# Patient Record
Sex: Male | Born: 1935 | Race: White | Hispanic: No | Marital: Married | Smoking: Former smoker
Health system: Southern US, Community
[De-identification: ages and names within clinical notes are randomized; demographics above are authoritative.]

## PROBLEM LIST (undated history)

## (undated) DIAGNOSIS — K219 Gastro-esophageal reflux disease without esophagitis: Secondary | ICD-10-CM

## (undated) DIAGNOSIS — E785 Hyperlipidemia, unspecified: Secondary | ICD-10-CM

## (undated) DIAGNOSIS — R609 Edema, unspecified: Secondary | ICD-10-CM

## (undated) DIAGNOSIS — N281 Cyst of kidney, acquired: Secondary | ICD-10-CM

## (undated) DIAGNOSIS — R001 Bradycardia, unspecified: Secondary | ICD-10-CM

## (undated) DIAGNOSIS — R7303 Prediabetes: Secondary | ICD-10-CM

## (undated) DIAGNOSIS — C189 Malignant neoplasm of colon, unspecified: Secondary | ICD-10-CM

## (undated) DIAGNOSIS — Z7901 Long term (current) use of anticoagulants: Secondary | ICD-10-CM

## (undated) DIAGNOSIS — I499 Cardiac arrhythmia, unspecified: Secondary | ICD-10-CM

## (undated) DIAGNOSIS — I82409 Acute embolism and thrombosis of unspecified deep veins of unspecified lower extremity: Secondary | ICD-10-CM

## (undated) DIAGNOSIS — K589 Irritable bowel syndrome without diarrhea: Secondary | ICD-10-CM

## (undated) DIAGNOSIS — I700 Atherosclerosis of aorta: Secondary | ICD-10-CM

## (undated) DIAGNOSIS — R002 Palpitations: Secondary | ICD-10-CM

## (undated) DIAGNOSIS — R011 Cardiac murmur, unspecified: Secondary | ICD-10-CM

## (undated) DIAGNOSIS — N183 Chronic kidney disease, stage 3 unspecified: Secondary | ICD-10-CM

## (undated) DIAGNOSIS — M4850XA Collapsed vertebra, not elsewhere classified, site unspecified, initial encounter for fracture: Secondary | ICD-10-CM

## (undated) DIAGNOSIS — H9190 Unspecified hearing loss, unspecified ear: Secondary | ICD-10-CM

## (undated) DIAGNOSIS — M4856XA Collapsed vertebra, not elsewhere classified, lumbar region, initial encounter for fracture: Secondary | ICD-10-CM

## (undated) DIAGNOSIS — C801 Malignant (primary) neoplasm, unspecified: Secondary | ICD-10-CM

## (undated) DIAGNOSIS — N2581 Secondary hyperparathyroidism of renal origin: Secondary | ICD-10-CM

## (undated) DIAGNOSIS — I4510 Unspecified right bundle-branch block: Secondary | ICD-10-CM

## (undated) DIAGNOSIS — Z87898 Personal history of other specified conditions: Secondary | ICD-10-CM

## (undated) DIAGNOSIS — R0600 Dyspnea, unspecified: Secondary | ICD-10-CM

## (undated) DIAGNOSIS — K279 Peptic ulcer, site unspecified, unspecified as acute or chronic, without hemorrhage or perforation: Secondary | ICD-10-CM

## (undated) DIAGNOSIS — I10 Essential (primary) hypertension: Secondary | ICD-10-CM

## (undated) DIAGNOSIS — K4020 Bilateral inguinal hernia, without obstruction or gangrene, not specified as recurrent: Secondary | ICD-10-CM

## (undated) DIAGNOSIS — I495 Sick sinus syndrome: Secondary | ICD-10-CM

## (undated) HISTORY — PX: PILONIDAL CYST EXCISION: SHX744

## (undated) HISTORY — PX: COLECTOMY: SHX59

---
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
## 2004-04-06 ENCOUNTER — Ambulatory Visit: Payer: Self-pay | Admitting: Oncology

## 2004-05-07 ENCOUNTER — Ambulatory Visit: Payer: Self-pay | Admitting: Oncology

## 2004-05-17 ENCOUNTER — Ambulatory Visit: Payer: Self-pay | Admitting: Gastroenterology

## 2004-06-06 ENCOUNTER — Ambulatory Visit: Payer: Self-pay | Admitting: Oncology

## 2004-07-07 HISTORY — PX: PARTIAL COLECTOMY: SHX5273

## 2004-07-07 HISTORY — PX: COLON SURGERY: SHX602

## 2004-07-26 ENCOUNTER — Ambulatory Visit: Payer: Self-pay | Admitting: Oncology

## 2004-08-07 ENCOUNTER — Ambulatory Visit: Payer: Self-pay | Admitting: Oncology

## 2004-10-25 ENCOUNTER — Ambulatory Visit: Payer: Self-pay | Admitting: Oncology

## 2004-11-04 ENCOUNTER — Ambulatory Visit: Payer: Self-pay | Admitting: Oncology

## 2005-02-28 ENCOUNTER — Ambulatory Visit: Payer: Self-pay | Admitting: Oncology

## 2005-03-07 ENCOUNTER — Ambulatory Visit: Payer: Self-pay | Admitting: Oncology

## 2005-05-15 ENCOUNTER — Ambulatory Visit: Payer: Self-pay | Admitting: Oncology

## 2005-06-06 ENCOUNTER — Ambulatory Visit: Payer: Self-pay | Admitting: Oncology

## 2005-07-18 ENCOUNTER — Ambulatory Visit: Payer: Self-pay | Admitting: Oncology

## 2005-08-07 ENCOUNTER — Ambulatory Visit: Payer: Self-pay | Admitting: Oncology

## 2006-01-14 ENCOUNTER — Ambulatory Visit: Payer: Self-pay | Admitting: Oncology

## 2006-02-04 ENCOUNTER — Ambulatory Visit: Payer: Self-pay | Admitting: Oncology

## 2006-03-03 ENCOUNTER — Ambulatory Visit: Payer: Self-pay | Admitting: Gastroenterology

## 2006-04-15 ENCOUNTER — Ambulatory Visit: Payer: Self-pay | Admitting: Oncology

## 2006-04-16 ENCOUNTER — Other Ambulatory Visit: Payer: Self-pay

## 2006-05-07 ENCOUNTER — Ambulatory Visit: Payer: Self-pay | Admitting: Oncology

## 2006-05-14 ENCOUNTER — Ambulatory Visit: Payer: Self-pay | Admitting: Cardiology

## 2006-07-14 ENCOUNTER — Ambulatory Visit: Payer: Self-pay | Admitting: Oncology

## 2006-08-07 ENCOUNTER — Ambulatory Visit: Payer: Self-pay | Admitting: Oncology

## 2006-09-05 ENCOUNTER — Ambulatory Visit: Payer: Self-pay | Admitting: Oncology

## 2006-09-30 ENCOUNTER — Ambulatory Visit: Payer: Self-pay | Admitting: Oncology

## 2006-10-06 ENCOUNTER — Ambulatory Visit: Payer: Self-pay | Admitting: Oncology

## 2007-01-05 ENCOUNTER — Ambulatory Visit: Payer: Self-pay | Admitting: Oncology

## 2007-01-13 ENCOUNTER — Ambulatory Visit: Payer: Self-pay | Admitting: Oncology

## 2007-02-05 ENCOUNTER — Ambulatory Visit: Payer: Self-pay | Admitting: Oncology

## 2007-05-08 ENCOUNTER — Ambulatory Visit: Payer: Self-pay | Admitting: Oncology

## 2007-05-13 ENCOUNTER — Ambulatory Visit: Payer: Self-pay | Admitting: Oncology

## 2007-06-07 ENCOUNTER — Ambulatory Visit: Payer: Self-pay | Admitting: Oncology

## 2007-10-06 ENCOUNTER — Ambulatory Visit: Payer: Self-pay | Admitting: Oncology

## 2008-04-03 ENCOUNTER — Ambulatory Visit: Payer: Self-pay | Admitting: Oncology

## 2008-04-06 ENCOUNTER — Ambulatory Visit: Payer: Self-pay | Admitting: Oncology

## 2009-04-20 ENCOUNTER — Ambulatory Visit: Payer: Self-pay | Admitting: Oncology

## 2009-05-07 ENCOUNTER — Ambulatory Visit: Payer: Self-pay | Admitting: Oncology

## 2009-07-12 ENCOUNTER — Ambulatory Visit: Payer: Self-pay | Admitting: Gastroenterology

## 2012-12-29 ENCOUNTER — Ambulatory Visit: Payer: Self-pay | Admitting: Gastroenterology

## 2012-12-31 LAB — PATHOLOGY REPORT

## 2016-07-07 DIAGNOSIS — Z9841 Cataract extraction status, right eye: Secondary | ICD-10-CM

## 2016-07-07 HISTORY — DX: Cataract extraction status, right eye: Z98.41

## 2016-10-23 ENCOUNTER — Encounter: Payer: Self-pay | Admitting: *Deleted

## 2016-10-28 MED ORDER — MOXIFLOXACIN HCL 0.5 % OP SOLN
1.0000 [drp] | OPHTHALMIC | Status: AC
  Administered 2016-10-29: 1 [drp] via OPHTHALMIC

## 2016-10-28 MED ORDER — PHENYLEPHRINE HCL 10 % OP SOLN
1.0000 [drp] | OPHTHALMIC | Status: AC
  Administered 2016-10-29: 1 [drp] via OPHTHALMIC

## 2016-10-28 MED ORDER — CYCLOPENTOLATE HCL 2 % OP SOLN
1.0000 [drp] | OPHTHALMIC | Status: AC
  Administered 2016-10-29: 1 [drp] via OPHTHALMIC

## 2016-10-28 NOTE — H&P (Signed)
See scanned note.

## 2016-10-29 ENCOUNTER — Ambulatory Visit
Admission: RE | Admit: 2016-10-29 | Discharge: 2016-10-29 | Disposition: A | Discharge status: Home / Self Care | Payer: Medicare Other | Source: Ambulatory Visit | Attending: Ophthalmology | Admitting: Ophthalmology

## 2016-10-29 ENCOUNTER — Ambulatory Visit: Payer: Medicare Other | Admitting: Anesthesiology

## 2016-10-29 ENCOUNTER — Encounter
Admission: RE | Disposition: A | Discharge status: Home / Self Care | Payer: Self-pay | Source: Ambulatory Visit | Attending: Ophthalmology

## 2016-10-29 ENCOUNTER — Encounter: Payer: Self-pay | Admitting: *Deleted

## 2016-10-29 DIAGNOSIS — Z87891 Personal history of nicotine dependence: Secondary | ICD-10-CM | POA: Insufficient documentation

## 2016-10-29 DIAGNOSIS — Z79899 Other long term (current) drug therapy: Secondary | ICD-10-CM | POA: Diagnosis not present

## 2016-10-29 DIAGNOSIS — H2512 Age-related nuclear cataract, left eye: Secondary | ICD-10-CM | POA: Insufficient documentation

## 2016-10-29 DIAGNOSIS — K219 Gastro-esophageal reflux disease without esophagitis: Secondary | ICD-10-CM | POA: Diagnosis not present

## 2016-10-29 HISTORY — DX: Malignant (primary) neoplasm, unspecified: C80.1

## 2016-10-29 HISTORY — DX: Gastro-esophageal reflux disease without esophagitis: K21.9

## 2016-10-29 HISTORY — DX: Unspecified hearing loss, unspecified ear: H91.90

## 2016-10-29 HISTORY — PX: CATARACT EXTRACTION W/PHACO: SHX586

## 2016-10-29 HISTORY — DX: Edema, unspecified: R60.9

## 2016-10-29 HISTORY — DX: Collapsed vertebra, not elsewhere classified, site unspecified, initial encounter for fracture: M48.50XA

## 2016-10-29 HISTORY — DX: Irritable bowel syndrome, unspecified: K58.9

## 2016-10-29 HISTORY — DX: Personal history of other specified conditions: Z87.898

## 2016-10-29 HISTORY — DX: Cardiac arrhythmia, unspecified: I49.9

## 2016-10-29 SURGERY — PHACOEMULSIFICATION, CATARACT, WITH IOL INSERTION
Anesthesia: Monitor Anesthesia Care | Site: Eye | Laterality: Left | Wound class: Clean

## 2016-10-29 MED ORDER — BUPIVACAINE HCL (PF) 0.75 % IJ SOLN
INTRAMUSCULAR | Status: AC
  Filled 2016-10-29: qty 10

## 2016-10-29 MED ORDER — NA CHONDROIT SULF-NA HYALURON 40-17 MG/ML IO SOLN
INTRAOCULAR | Status: AC
  Filled 2016-10-29: qty 1

## 2016-10-29 MED ORDER — MIDAZOLAM HCL 2 MG/2ML IJ SOLN
INTRAMUSCULAR | Status: DC | PRN
  Administered 2016-10-29 (×2): 0.5 mg via INTRAVENOUS
  Administered 2016-10-29: 1 mg via INTRAVENOUS

## 2016-10-29 MED ORDER — NA CHONDROIT SULF-NA HYALURON 40-17 MG/ML IO SOLN
INTRAOCULAR | Status: DC | PRN
  Administered 2016-10-29 (×2): 1 mL via INTRAOCULAR

## 2016-10-29 MED ORDER — GLYCOPYRROLATE 0.2 MG/ML IJ SOLN
INTRAMUSCULAR | Status: DC | PRN
  Administered 2016-10-29: 0.2 mg via INTRAVENOUS

## 2016-10-29 MED ORDER — MOXIFLOXACIN HCL 0.5 % OP SOLN
OPHTHALMIC | Status: DC | PRN
  Administered 2016-10-29: 0.2 mL via OPHTHALMIC

## 2016-10-29 MED ORDER — ALFENTANIL 500 MCG/ML IJ INJ
INJECTION | INTRAVENOUS | Status: DC | PRN
  Administered 2016-10-29: 250 ug via INTRAVENOUS
  Administered 2016-10-29: 500 ug via INTRAVENOUS

## 2016-10-29 MED ORDER — CEFUROXIME OPHTHALMIC INJECTION 1 MG/0.1 ML
INJECTION | OPHTHALMIC | Status: DC | PRN
  Administered 2016-10-29: 1 mg via INTRACAMERAL

## 2016-10-29 MED ORDER — MOXIFLOXACIN HCL 0.5 % OP SOLN
1.0000 [drp] | OPHTHALMIC | Status: AC
  Administered 2016-10-29 (×2): 1 [drp] via OPHTHALMIC

## 2016-10-29 MED ORDER — CEFUROXIME OPHTHALMIC INJECTION 1 MG/0.1 ML
INJECTION | OPHTHALMIC | Status: AC
  Filled 2016-10-29: qty 0.1

## 2016-10-29 MED ORDER — PHENYLEPHRINE HCL 10 % OP SOLN
OPHTHALMIC | Status: AC
  Administered 2016-10-29: 1 [drp] via OPHTHALMIC
  Filled 2016-10-29: qty 5

## 2016-10-29 MED ORDER — EPINEPHRINE PF 1 MG/ML IJ SOLN
INTRAMUSCULAR | Status: AC
  Filled 2016-10-29: qty 2

## 2016-10-29 MED ORDER — POVIDONE-IODINE 5 % OP SOLN
OPHTHALMIC | Status: DC | PRN
  Administered 2016-10-29: 1 via OPHTHALMIC

## 2016-10-29 MED ORDER — CYCLOPENTOLATE HCL 2 % OP SOLN
1.0000 [drp] | OPHTHALMIC | Status: AC
  Administered 2016-10-29 (×3): 1 [drp] via OPHTHALMIC

## 2016-10-29 MED ORDER — CYCLOPENTOLATE HCL 2 % OP SOLN
OPHTHALMIC | Status: AC
  Administered 2016-10-29: 1 [drp] via OPHTHALMIC
  Filled 2016-10-29: qty 2

## 2016-10-29 MED ORDER — PROPARACAINE HCL 0.5 % OP SOLN
OPHTHALMIC | Status: DC | PRN
  Administered 2016-10-29: 1 [drp] via OPHTHALMIC

## 2016-10-29 MED ORDER — CARBACHOL 0.01 % IO SOLN
INTRAOCULAR | Status: DC | PRN
  Administered 2016-10-29: 0.5 mL via INTRAOCULAR

## 2016-10-29 MED ORDER — TETRACAINE HCL 0.5 % OP SOLN
OPHTHALMIC | Status: DC | PRN
  Administered 2016-10-29: 1 [drp] via OPHTHALMIC

## 2016-10-29 MED ORDER — POVIDONE-IODINE 5 % OP SOLN
OPHTHALMIC | Status: AC
  Filled 2016-10-29: qty 30

## 2016-10-29 MED ORDER — SODIUM CHLORIDE 0.9 % IV SOLN
INTRAVENOUS | Status: DC
  Administered 2016-10-29: 50 mL/h via INTRAVENOUS

## 2016-10-29 MED ORDER — PHENYLEPHRINE HCL 10 % OP SOLN
1.0000 [drp] | OPHTHALMIC | Status: AC
  Administered 2016-10-29 (×3): 1 [drp] via OPHTHALMIC

## 2016-10-29 MED ORDER — LIDOCAINE HCL (PF) 4 % IJ SOLN
INTRAMUSCULAR | Status: DC | PRN
  Administered 2016-10-29: 4 mL via OPHTHALMIC

## 2016-10-29 MED ORDER — MOXIFLOXACIN HCL 0.5 % OP SOLN
OPHTHALMIC | Status: AC
  Administered 2016-10-29: 1 [drp] via OPHTHALMIC
  Filled 2016-10-29: qty 3

## 2016-10-29 MED ORDER — LIDOCAINE HCL (PF) 2 % IJ SOLN
INTRAMUSCULAR | Status: AC
  Filled 2016-10-29: qty 2

## 2016-10-29 MED ORDER — TETRACAINE HCL 0.5 % OP SOLN
OPHTHALMIC | Status: AC
  Filled 2016-10-29: qty 2

## 2016-10-29 MED ORDER — HYALURONIDASE HUMAN 150 UNIT/ML IJ SOLN
INTRAMUSCULAR | Status: AC
  Filled 2016-10-29: qty 1

## 2016-10-29 MED ORDER — LIDOCAINE HCL (PF) 4 % IJ SOLN
INTRAMUSCULAR | Status: DC | PRN
  Administered 2016-10-29: 9 mL via OPHTHALMIC

## 2016-10-29 MED ORDER — EPINEPHRINE PF 1 MG/ML IJ SOLN
INTRAMUSCULAR | Status: DC | PRN
  Administered 2016-10-29: 10:00:00 via OPHTHALMIC

## 2016-10-29 SURGICAL SUPPLY — 34 items
CANNULA ANT/CHMB 27G (MISCELLANEOUS) ×1 IMPLANT
CANNULA ANT/CHMB 27GA (MISCELLANEOUS) ×3 IMPLANT
CORD BIP STRL DISP 12FT (MISCELLANEOUS) ×3 IMPLANT
CUP MEDICINE 2OZ PLAST GRAD ST (MISCELLANEOUS) ×3 IMPLANT
DRAPE XRAY CASSETTE 23X24 (DRAPES) ×3 IMPLANT
ERASER HMR WETFIELD 18G (MISCELLANEOUS) ×3 IMPLANT
GLOVE BIO SURGEON STRL SZ8 (GLOVE) ×3 IMPLANT
GLOVE SURG LX 6.5 MICRO (GLOVE) ×2
GLOVE SURG LX 8.0 MICRO (GLOVE) ×2
GLOVE SURG LX STRL 6.5 MICRO (GLOVE) ×1 IMPLANT
GLOVE SURG LX STRL 8.0 MICRO (GLOVE) ×1 IMPLANT
GOWN STRL REUS W/ TWL LRG LVL3 (GOWN DISPOSABLE) ×1 IMPLANT
GOWN STRL REUS W/ TWL XL LVL3 (GOWN DISPOSABLE) ×1 IMPLANT
GOWN STRL REUS W/TWL LRG LVL3 (GOWN DISPOSABLE) ×3
GOWN STRL REUS W/TWL XL LVL3 (GOWN DISPOSABLE) ×3
KIT IRRIGAT 0.9 MICROSMOOTH (MISCELLANEOUS) ×2 IMPLANT
LENS IOL ACRSF IQ TRC 8 18.5 IMPLANT
LENS IOL ACRYSOF IQ TORIC 18.5 ×3 IMPLANT
LENS IOL IQ TORIC 8 18.5 ×1 IMPLANT
PACK CATARACT (MISCELLANEOUS) ×3 IMPLANT
PACK CATARACT DINGLEDEIN LX (MISCELLANEOUS) ×3 IMPLANT
PACK EYE AFTER SURG (MISCELLANEOUS) ×3 IMPLANT
SHLD EYE VISITEC  UNIV (MISCELLANEOUS) ×3 IMPLANT
SOL BSS BAG (MISCELLANEOUS) ×3
SOL PREP PVP 2OZ (MISCELLANEOUS) ×3
SOLUTION BSS BAG (MISCELLANEOUS) ×1 IMPLANT
SOLUTION PREP PVP 2OZ (MISCELLANEOUS) ×1 IMPLANT
SUT ETHILON 10 0 CS140 6 (SUTURE) ×2 IMPLANT
SUT SILK 5-0 (SUTURE) ×3 IMPLANT
SYR 3ML LL SCALE MARK (SYRINGE) ×3 IMPLANT
SYR 5ML LL (SYRINGE) ×3 IMPLANT
SYR TB 1ML 27GX1/2 LL (SYRINGE) ×3 IMPLANT
WATER STERILE IRR 250ML POUR (IV SOLUTION) ×3 IMPLANT
WIPE NON LINTING 3.25X3.25 (MISCELLANEOUS) ×3 IMPLANT

## 2016-10-29 NOTE — Interval H&P Note (Signed)
History and Physical Interval Note:  10/29/2016 9:56 AM  Juan Arnold  has presented today for surgery, with the diagnosis of NUCLEAR SCLEROTIC CATARACT LEFT EYE  The various methods of treatment have been discussed with the patient and family. After consideration of risks, benefits and other options for treatment, the patient has consented to  Procedure(s) with comments: CATARACT EXTRACTION PHACO AND INTRAOCULAR LENS PLACEMENT (IOC) (Left) - [place] AP% CDE FLUID PACK LOT # [id number] H as a surgical intervention .  The patient's history has been reviewed, patient examined, no change in status, stable for surgery.  I have reviewed the patient's chart and labs.  Questions were answered to the patient's satisfaction.     [name]

## 2016-10-29 NOTE — Anesthesia Postprocedure Evaluation (Signed)
Anesthesia Post Note  Patient: Juan Arnold  Procedure(s) Performed: Procedure(s) (LRB): CATARACT EXTRACTION PHACO AND INTRAOCULAR LENS PLACEMENT (IOC) (Left)  Patient location during evaluation: Short Stay Anesthesia Type: MAC Level of consciousness: awake, awake and alert and oriented Pain management: pain level controlled Vital Signs Assessment: post-procedure vital signs reviewed and stable Respiratory status: spontaneous breathing Cardiovascular status: stable Postop Assessment: no headache and adequate PO intake Anesthetic complications: no     Last Vitals:  Vitals:   10/29/16 1056 10/29/16 1101  BP: (!) 172/90 (!) 142/75  Pulse: 68   Resp:    Temp: 36.7 C     Last Pain:  Vitals:   10/29/16 1056  TempSrc: Tympanic                 [name]

## 2016-10-29 NOTE — Anesthesia Post-op Follow-up Note (Cosign Needed)
Anesthesia QCDR form completed.        

## 2016-10-29 NOTE — Transfer of Care (Signed)
Immediate Anesthesia Transfer of Care Note  Patient: Juan Arnold  Procedure(s) Performed: Procedure(s) with comments: CATARACT EXTRACTION PHACO AND INTRAOCULAR LENS PLACEMENT (IOC) (Left) - [place] 2:05.4 AP% 25.3 CDE 49.50 FLUID PACK LOT # [id number]  Patient Location: PACU and Short Stay  Anesthesia Type:MAC  Level of Consciousness: awake, alert  and oriented  Airway & Oxygen Therapy: Patient Spontanous Breathing  Post-op Assessment: Report given to RN and Post -op Vital signs reviewed and stable  Post vital signs: Reviewed and stable  Last Vitals:  Vitals:   10/29/16 0849 10/29/16 1056  BP: (!) 164/63 (!) 172/90  Pulse:  68  Resp:    Temp:  36.7 C    Last Pain:  Vitals:   10/29/16 1056  TempSrc: Tympanic      Patients Stated Pain Goal: 0 (18/59/09 3112)  Complications: No apparent anesthesia complications

## 2016-10-29 NOTE — Op Note (Signed)
Date of Surgery: 10/29/2016 Date of Dictation: 10/29/2016 10:52 AM Pre-operative Diagnosis:Nuclear Sclerotic Cataract left Eye Post-operative Diagnosis: same Procedure performed: Extra-capsular Cataract Extraction (ECCE) with placement of a posterior chamber intraocular lens (IOL) left Eye IOL:  Implant Name Type Inv. Item Serial No. Manufacturer Lot No. LRB No. Used  LENS IOL TORIC 18.5 - [id number]   LENS IOL TORIC 18.5 [id number] ALCON   Left 1   Anesthesia: 2% Lidocaine and 4% Marcaine in a 50/50 mixture with 10 unites/ml of Hylenex given as a peribulbar Anesthesiologist: Anesthesiologist: Gunnar Fusi, MD CRNA: Jonna Clark, CRNA Complications: none Estimated Blood Loss: less than 1 ml  Description of procedure:  The patient sat upright and the eyes were anesthetized with topical proparacaine. With the patient fixing at a distant target the 3 o'clock and 9 o'clock positions at the limbus were marked with an sterile indelible surgical marking pen.  The patient was given anesthesia and sedation via intravenous access. The patient was then prepped and draped in the usual fashion. A 25-gauge needle was bent to use for starting the capsulorhexis. A 5-0 silk suture was placed through the conjunctiva superior and inferiorly to serve as bridle sutures.   The [place] system was engaged and registration was performed. The positions of the incisions and the steep axis of the astigmatism were identified by the [place] system and marked with an indelible pen. The Verion heads up display was turned off.  Hemostasis was obtained at the the position of the main incision using an eraser cautery. A partial thickness groove was made at the at that location with a 64 Beaver blade and this was dissected anteriorly with an Avaya. The anterior chamber was entered at 10 o'clock with a 1.0 mm paracentesis knife and through the lamellar dissection with a 2.6 mm Alcon keratome.  Epi-Shugarcaine 0.5 CC [9 cc BSS Plus (Alcon), 3 cc 4% preservative-free lidocaine (Hospira) and 4 cc 1:1000 preservative-free, bisulfite-free epinephrine] was injected into the anterior chamber via the paracentesis tract. DiscoVisc was injected to replace the aqueous and a continuous tear curvilinear capsulorhexis was performed using a bent 25-gauge needle.  Balance salt on a syringe was used to perform hydro-dissection and phacoemulsification was carried out using a divide and conquer technique. Procedure(s) with comments: CATARACT EXTRACTION PHACO AND INTRAOCULAR LENS PLACEMENT (IOC) (Left) - [place] 2:05.4 AP% 25.3 CDE 49.50 FLUID PACK LOT # [id number] H. Irrigation/aspiration was used to remove the residual cortex and the capsular bag was inflated with DiscoVisc. The intraocular lens was inserted into the capsular bag using a Monarch Delivery System cartridge. The Verion heads up display was turned on and the lens was rotated so that the marks on the base of the haptics were aligned with the steep axis of astigmatism as identified by the [place] unit.   Irrigation/aspiration was used to remove the residual DiscoVisc. The I/A hand piece was pressed down on top of the lens to prevent rotation. The wound was inflated with balanced salt and checked for leaks. None were found. The position of the Toric lens was reconfirmed using the [place] unit. Miostat was injected via the paracentesis track and 0.1 ml of cefuroxime containing 1 mg of drug was injected via the paracentesis track. The wound was checked for leaks again and none were found.   The bridal sutures were removed and two drops of Vigamox were placed on the eye. An eye shield was placed to protect the eye and the patient was discharged to the  recovery area in good condition.   Dareen Gutzwiller MD @T @

## 2016-10-29 NOTE — Discharge Instructions (Signed)
Eye Surgery Discharge Instructions  Expect mild scratchy sensation or mild soreness. DO NOT RUB YOUR EYE!  The day of surgery:  Minimal physical activity, but bed rest is not required  No reading, computer work, or close hand work  No bending, lifting, or straining.  May watch TV  For 24 hours:  No driving, legal decisions, or alcoholic beverages  Safety precautions  Eat anything you prefer: It is better to start with liquids, then soup then solid foods.  _____ Eye patch should be worn until postoperative exam tomorrow.  ____ Solar shield eyeglasses should be worn for comfort in the sunlight/patch while sleeping  Resume all regular medications including aspirin or Coumadin if these were discontinued prior to surgery. You may shower, bathe, shave, or wash your hair. Tylenol may be taken for mild discomfort.  Call your doctor if you experience significant pain, nausea, or vomiting, fever > 101 or other signs of infection. [phone] or [phone] Specific instructions:  Follow-up Information    Laterica Matarazzo, MD Follow up.   Specialty:  Ophthalmology Why:  April 26 at 11:00am Contact information: [address]   [place] [address] [phone]

## 2016-10-29 NOTE — Anesthesia Preprocedure Evaluation (Addendum)
Anesthesia Evaluation  Patient identified by MRN, date of birth, ID band Patient awake    Reviewed: Allergy & Precautions, NPO status , Patient's Chart, lab work & pertinent test results  History of Anesthesia Complications Negative for: history of anesthetic complications  Airway Mallampati: II       Dental   Pulmonary neg pulmonary ROS, former smoker,           Cardiovascular Pt. on home beta blockers + dysrhythmias (SB)      Neuro/Psych negative neurological ROS     GI/Hepatic Neg liver ROS, GERD  Medicated and Controlled,  Endo/Other  negative endocrine ROS  Renal/GU negative Renal ROS     Musculoskeletal   Abdominal   Peds  Hematology negative hematology ROS (+)   Anesthesia Other Findings   Reproductive/Obstetrics                            Anesthesia Physical Anesthesia Plan  ASA: II  Anesthesia Plan: MAC   Post-op Pain Management:    Induction: Intravenous  Airway Management Planned: Nasal Cannula  Additional Equipment:   Intra-op Plan:   Post-operative Plan:   Informed Consent: I have reviewed the patients History and Physical, chart, labs and discussed the procedure including the risks, benefits and alternatives for the proposed anesthesia with the patient or authorized representative who has indicated his/her understanding and acceptance.     Plan Discussed with:   Anesthesia Plan Comments:         Anesthesia Quick Evaluation

## 2016-11-24 ENCOUNTER — Encounter: Payer: Self-pay | Admitting: *Deleted

## 2016-11-25 NOTE — H&P (Signed)
See scanned note.

## 2016-11-26 ENCOUNTER — Encounter
Admission: RE | Disposition: A | Discharge status: Home / Self Care | Payer: Self-pay | Source: Ambulatory Visit | Attending: Ophthalmology

## 2016-11-26 ENCOUNTER — Ambulatory Visit
Admission: RE | Admit: 2016-11-26 | Discharge: 2016-11-26 | Disposition: A | Discharge status: Home / Self Care | Payer: Medicare Other | Source: Ambulatory Visit | Attending: Ophthalmology | Admitting: Ophthalmology

## 2016-11-26 ENCOUNTER — Ambulatory Visit: Payer: Medicare Other | Admitting: Anesthesiology

## 2016-11-26 ENCOUNTER — Encounter: Payer: Self-pay | Admitting: *Deleted

## 2016-11-26 DIAGNOSIS — K219 Gastro-esophageal reflux disease without esophagitis: Secondary | ICD-10-CM | POA: Diagnosis not present

## 2016-11-26 DIAGNOSIS — Z9842 Cataract extraction status, left eye: Secondary | ICD-10-CM | POA: Diagnosis not present

## 2016-11-26 DIAGNOSIS — Z79899 Other long term (current) drug therapy: Secondary | ICD-10-CM | POA: Insufficient documentation

## 2016-11-26 DIAGNOSIS — I499 Cardiac arrhythmia, unspecified: Secondary | ICD-10-CM | POA: Insufficient documentation

## 2016-11-26 DIAGNOSIS — H2511 Age-related nuclear cataract, right eye: Secondary | ICD-10-CM | POA: Insufficient documentation

## 2016-11-26 DIAGNOSIS — Z87891 Personal history of nicotine dependence: Secondary | ICD-10-CM | POA: Insufficient documentation

## 2016-11-26 DIAGNOSIS — M719 Bursopathy, unspecified: Secondary | ICD-10-CM | POA: Insufficient documentation

## 2016-11-26 DIAGNOSIS — Z8589 Personal history of malignant neoplasm of other organs and systems: Secondary | ICD-10-CM | POA: Insufficient documentation

## 2016-11-26 DIAGNOSIS — Z9049 Acquired absence of other specified parts of digestive tract: Secondary | ICD-10-CM | POA: Diagnosis not present

## 2016-11-26 DIAGNOSIS — R002 Palpitations: Secondary | ICD-10-CM | POA: Insufficient documentation

## 2016-11-26 DIAGNOSIS — K589 Irritable bowel syndrome without diarrhea: Secondary | ICD-10-CM | POA: Insufficient documentation

## 2016-11-26 DIAGNOSIS — M7989 Other specified soft tissue disorders: Secondary | ICD-10-CM | POA: Insufficient documentation

## 2016-11-26 HISTORY — PX: CATARACT EXTRACTION W/PHACO: SHX586

## 2016-11-26 SURGERY — PHACOEMULSIFICATION, CATARACT, WITH IOL INSERTION
Anesthesia: Monitor Anesthesia Care | Site: Eye | Laterality: Right | Wound class: Clean

## 2016-11-26 MED ORDER — PHENYLEPHRINE HCL 10 % OP SOLN
1.0000 [drp] | OPHTHALMIC | Status: DC
  Administered 2016-11-26 (×4): 1 [drp] via OPHTHALMIC

## 2016-11-26 MED ORDER — TETRACAINE HCL 0.5 % OP SOLN
OPHTHALMIC | Status: AC
  Filled 2016-11-26: qty 2

## 2016-11-26 MED ORDER — LIDOCAINE HCL (PF) 4 % IJ SOLN
INTRAOCULAR | Status: DC | PRN
  Administered 2016-11-26: 4 mL via OPHTHALMIC

## 2016-11-26 MED ORDER — BUPIVACAINE HCL (PF) 0.75 % IJ SOLN
INTRAMUSCULAR | Status: AC
  Filled 2016-11-26: qty 10

## 2016-11-26 MED ORDER — CEFUROXIME OPHTHALMIC INJECTION 1 MG/0.1 ML
INJECTION | OPHTHALMIC | Status: DC | PRN
  Administered 2016-11-26: 1 mg via INTRACAMERAL

## 2016-11-26 MED ORDER — CARBACHOL 0.01 % IO SOLN
INTRAOCULAR | Status: DC | PRN
  Administered 2016-11-26: 0.5 mL via INTRAOCULAR

## 2016-11-26 MED ORDER — MIDAZOLAM HCL 2 MG/2ML IJ SOLN
INTRAMUSCULAR | Status: DC | PRN
  Administered 2016-11-26: .5 mg via INTRAVENOUS

## 2016-11-26 MED ORDER — CYCLOPENTOLATE HCL 2 % OP SOLN
1.0000 [drp] | OPHTHALMIC | Status: AC
  Administered 2016-11-26 (×4): 1 [drp] via OPHTHALMIC

## 2016-11-26 MED ORDER — EPINEPHRINE PF 1 MG/ML IJ SOLN
INTRAMUSCULAR | Status: DC | PRN
  Administered 2016-11-26: 09:00:00 via OPHTHALMIC

## 2016-11-26 MED ORDER — SODIUM CHLORIDE 0.9 % IV SOLN
INTRAVENOUS | Status: DC
  Administered 2016-11-26 (×2): via INTRAVENOUS

## 2016-11-26 MED ORDER — POVIDONE-IODINE 5 % OP SOLN
OPHTHALMIC | Status: DC | PRN
  Administered 2016-11-26: 1 via OPHTHALMIC

## 2016-11-26 MED ORDER — CEFUROXIME OPHTHALMIC INJECTION 1 MG/0.1 ML
INJECTION | OPHTHALMIC | Status: AC
  Filled 2016-11-26: qty 0.1

## 2016-11-26 MED ORDER — NA CHONDROIT SULF-NA HYALURON 40-17 MG/ML IO SOLN
INTRAOCULAR | Status: DC | PRN
  Administered 2016-11-26: 1 mL via INTRAOCULAR

## 2016-11-26 MED ORDER — MOXIFLOXACIN HCL 0.5 % OP SOLN
1.0000 [drp] | OPHTHALMIC | Status: AC
  Administered 2016-11-26 (×2): 1 [drp] via OPHTHALMIC

## 2016-11-26 MED ORDER — PROPARACAINE HCL 0.5 % OP SOLN
OPHTHALMIC | Status: AC
  Filled 2016-11-26: qty 15

## 2016-11-26 MED ORDER — EPINEPHRINE PF 1 MG/ML IJ SOLN
INTRAMUSCULAR | Status: AC
  Filled 2016-11-26: qty 2

## 2016-11-26 MED ORDER — ALFENTANIL 500 MCG/ML IJ INJ
1000.0000 ug | INJECTION | Freq: Once | INTRAVENOUS | Status: AC
  Administered 2016-11-26: 750 ug via INTRAVENOUS
  Filled 2016-11-26: qty 2

## 2016-11-26 MED ORDER — MIDAZOLAM HCL 2 MG/2ML IJ SOLN
INTRAMUSCULAR | Status: AC
  Filled 2016-11-26: qty 2

## 2016-11-26 MED ORDER — CYCLOPENTOLATE HCL 2 % OP SOLN
OPHTHALMIC | Status: AC
  Administered 2016-11-26: 1 [drp] via OPHTHALMIC
  Filled 2016-11-26: qty 2

## 2016-11-26 MED ORDER — HYALURONIDASE HUMAN 150 UNIT/ML IJ SOLN
INTRAMUSCULAR | Status: AC
  Filled 2016-11-26: qty 1

## 2016-11-26 MED ORDER — NA CHONDROIT SULF-NA HYALURON 40-17 MG/ML IO SOLN
INTRAOCULAR | Status: AC
  Filled 2016-11-26: qty 1

## 2016-11-26 MED ORDER — LIDOCAINE HCL (PF) 4 % IJ SOLN
INTRAMUSCULAR | Status: DC | PRN
  Administered 2016-11-26: 9 mL via OPHTHALMIC

## 2016-11-26 MED ORDER — MOXIFLOXACIN HCL 0.5 % OP SOLN
OPHTHALMIC | Status: AC
  Administered 2016-11-26: 1 [drp] via OPHTHALMIC
  Filled 2016-11-26: qty 3

## 2016-11-26 MED ORDER — TETRACAINE HCL 0.5 % OP SOLN
OPHTHALMIC | Status: DC | PRN
Start: 2016-11-26 — End: 2016-11-26
  Administered 2016-11-26: 1 [drp] via OPHTHALMIC

## 2016-11-26 MED ORDER — POVIDONE-IODINE 5 % OP SOLN
OPHTHALMIC | Status: AC
  Filled 2016-11-26: qty 30

## 2016-11-26 MED ORDER — PROPARACAINE HCL 0.5 % OP SOLN
OPHTHALMIC | Status: DC | PRN
  Administered 2016-11-26: 2 [drp] via OPHTHALMIC

## 2016-11-26 MED ORDER — PHENYLEPHRINE HCL 10 % OP SOLN
OPHTHALMIC | Status: AC
  Administered 2016-11-26: 1 [drp] via OPHTHALMIC
  Filled 2016-11-26: qty 5

## 2016-11-26 SURGICAL SUPPLY — 25 items
CORD BIP STRL DISP 12FT (MISCELLANEOUS) ×3 IMPLANT
DRAPE XRAY CASSETTE 23X24 (DRAPES) ×3 IMPLANT
ERASER HMR WETFIELD 18G (MISCELLANEOUS) ×3 IMPLANT
GLOVE BIO SURGEON STRL SZ8 (GLOVE) ×3 IMPLANT
GLOVE SURG LX 6.5 MICRO (GLOVE) ×2
GLOVE SURG LX 8.0 MICRO (GLOVE) ×2
GLOVE SURG LX STRL 6.5 MICRO (GLOVE) ×1 IMPLANT
GLOVE SURG LX STRL 8.0 MICRO (GLOVE) ×1 IMPLANT
GOWN STRL REUS W/ TWL LRG LVL3 (GOWN DISPOSABLE) ×1 IMPLANT
GOWN STRL REUS W/ TWL XL LVL3 (GOWN DISPOSABLE) ×1 IMPLANT
GOWN STRL REUS W/TWL LRG LVL3 (GOWN DISPOSABLE) ×3
GOWN STRL REUS W/TWL XL LVL3 (GOWN DISPOSABLE) ×3
LENS IOL ACRSF IQ TRC 7 18.5 (Intraocular Lens) IMPLANT
LENS IOL ACRYSOF IQ TORIC 18.5 (Intraocular Lens) ×3 IMPLANT
LENS IOL IQ TORIC 7 18.5 (Intraocular Lens) ×1 IMPLANT
PACK CATARACT (MISCELLANEOUS) ×3 IMPLANT
PACK CATARACT DINGLEDEIN LX (MISCELLANEOUS) ×3 IMPLANT
PACK EYE AFTER SURG (MISCELLANEOUS) ×3 IMPLANT
SHLD EYE VISITEC  UNIV (MISCELLANEOUS) ×3 IMPLANT
SOL BSS BAG (MISCELLANEOUS) ×3
SOLUTION BSS BAG (MISCELLANEOUS) ×1 IMPLANT
SUT SILK 5-0 (SUTURE) ×3 IMPLANT
SYR 5ML LL (SYRINGE) ×3 IMPLANT
WATER STERILE IRR 250ML POUR (IV SOLUTION) ×3 IMPLANT
WIPE NON LINTING 3.25X3.25 (MISCELLANEOUS) ×3 IMPLANT

## 2016-11-26 NOTE — Anesthesia Preprocedure Evaluation (Signed)
Anesthesia Evaluation  Patient identified by MRN, date of birth, ID band Patient awake    Reviewed: Allergy & Precautions, NPO status , Patient's Chart, lab work & pertinent test results, reviewed documented beta blocker date and time   History of Anesthesia Complications Negative for: history of anesthetic complications  Airway Mallampati: II       Dental  (+) Upper Dentures, Lower Dentures   Pulmonary neg pulmonary ROS, former smoker,           Cardiovascular Pt. on home beta blockers      Neuro/Psych negative neurological ROS  negative psych ROS   GI/Hepatic Neg liver ROS, GERD  Medicated and Controlled,  Endo/Other  negative endocrine ROS  Renal/GU negative Renal ROS     Musculoskeletal   Abdominal   Peds  Hematology   Anesthesia Other Findings   Reproductive/Obstetrics                             Anesthesia Physical Anesthesia Plan  ASA: II  Anesthesia Plan: MAC   Post-op Pain Management:    Induction: Intravenous  Airway Management Planned: Nasal Cannula  Additional Equipment:   Intra-op Plan:   Post-operative Plan:   Informed Consent: I have reviewed the patients History and Physical, chart, labs and discussed the procedure including the risks, benefits and alternatives for the proposed anesthesia with the patient or authorized representative who has indicated his/her understanding and acceptance.     Plan Discussed with:   Anesthesia Plan Comments:         Anesthesia Quick Evaluation

## 2016-11-26 NOTE — Anesthesia Post-op Follow-up Note (Cosign Needed)
Anesthesia QCDR form completed.        

## 2016-11-26 NOTE — Op Note (Signed)
Date of Surgery: 11/26/2016 Date of Dictation: 11/26/2016 9:30 AM Pre-operative Diagnosis:Nuclear Sclerotic Cataract right Eye Post-operative Diagnosis: same Procedure performed: Extra-capsular Cataract Extraction (ECCE) with placement of a posterior chamber intraocular lens (IOL) right Eye IOL:  Implant Name Type Inv. Item Serial No. Manufacturer Lot No. LRB No. Used  LENS IOL TORIC 18.5 - [id number] Intraocular Lens LENS IOL TORIC 18.5 [id number] ALCON   Right 1   Anesthesia: 2% Lidocaine and 4% Marcaine in a 50/50 mixture with 10 unites/ml of Hylenex given as a peribulbar Anesthesiologist: Anesthesiologist: Gunnar Fusi, MD CRNA: Allean Found, CRNA; Silvana Newness, CRNA Complications: none Estimated Blood Loss: less than 1 ml  Description of procedure:  The patient sat upright and the eyes were anesthetized with topical proparacaine. With the patient fixing at a distant target the 3 o'clock and 9 o'clock positions at the limbus were marked with an sterile indelible surgical marking pen.  The patient was given anesthesia and sedation via intravenous access. The patient was then prepped and draped in the usual fashion. A 25-gauge needle was bent to use for starting the capsulorhexis. A 5-0 silk suture was placed through the conjunctiva superior and inferiorly to serve as bridle sutures.   The [place] system was engaged and registration was performed. The positions of the incisions and the steep axis of the astigmatism were identified by the [place] system and marked with an indelible pen. The Verion heads up display was turned off.  Hemostasis was obtained at the the position of the main incision using an eraser cautery. A partial thickness groove was made at the at that location with a 64 Beaver blade and this was dissected anteriorly with an Avaya. The anterior chamber was entered at 10 o'clock with a 1.0 mm paracentesis knife and through the lamellar dissection with  a 2.6 mm Alcon keratome. Epi-Shugarcaine 0.5 CC [9 cc BSS Plus (Alcon), 3 cc 4% preservative-free lidocaine (Hospira) and 4 cc 1:1000 preservative-free, bisulfite-free epinephrine] was injected into the anterior chamber via the paracentesis tract. DiscoVisc was injected to replace the aqueous and a continuous tear curvilinear capsulorhexis was performed using a bent 25-gauge needle.  Balance salt on a syringe was used to perform hydro-dissection and phacoemulsification was carried out using a divide and conquer technique. Procedure(s) with comments: CATARACT EXTRACTION PHACO AND INTRAOCULAR LENS PLACEMENT (IOC) (Right) - [place] 1:36.9 AP% 22.2 CDE 33.97 FLUID PACK LOT # [id number] H. Irrigation/aspiration was used to remove the residual cortex and the capsular bag was inflated with DiscoVisc. The intraocular lens was inserted into the capsular bag using a Monarch Delivery System cartridge. The Verion heads up display was turned on and the lens was rotated so that the marks on the base of the haptics were aligned with the steep axis of astigmatism as identified by the [place] unit.   Irrigation/aspiration was used to remove the residual DiscoVisc. The I/A hand piece was pressed down on top of the lens to prevent rotation. The wound was inflated with balanced salt and checked for leaks. None were found. The position of the Toric lens was reconfirmed using the [place] unit. Miostat was injected via the paracentesis track and 0.1 ml of cefuroxime containing 1 mg of drug was injected via the paracentesis track. The wound was checked for leaks again and none were found.   The bridal sutures were removed and two drops of Vigamox were placed on the eye. An eye shield was placed to protect the eye and the patient was  discharged to the recovery area in good condition.   Mickayla Trouten MD @T @

## 2016-11-26 NOTE — Anesthesia Postprocedure Evaluation (Signed)
Anesthesia Post Note  Patient: Juan Arnold  Procedure(s) Performed: Procedure(s) (LRB): CATARACT EXTRACTION PHACO AND INTRAOCULAR LENS PLACEMENT (IOC) (Right)  Patient location during evaluation: Short Stay Anesthesia Type: MAC Level of consciousness: awake Pain management: pain level controlled Vital Signs Assessment: post-procedure vital signs reviewed and stable Respiratory status: spontaneous breathing Cardiovascular status: blood pressure returned to baseline Anesthetic complications: no     Last Vitals:  Vitals:   11/26/16 0652 11/26/16 0930  BP: (!) 151/74 134/62  Pulse: 60 (!) 53  Resp:  16  Temp: (!) 35.6 C 36.5 C    Last Pain:  Vitals:   11/26/16 0652  TempSrc: Tympanic                 Buckner Malta

## 2016-11-26 NOTE — Discharge Instructions (Signed)
Eye Surgery Discharge Instructions  Expect mild scratchy sensation or mild soreness. DO NOT RUB YOUR EYE!  The day of surgery:  Minimal physical activity, but bed rest is not required  No reading, computer work, or close hand work  No bending, lifting, or straining.  May watch TV  For 24 hours:  No driving, legal decisions, or alcoholic beverages  Safety precautions  Eat anything you prefer: It is better to start with liquids, then soup then solid foods.  _____ Eye patch should be worn until postoperative exam tomorrow.  ____ Solar shield eyeglasses should be worn for comfort in the sunlight/patch while sleeping  Resume all regular medications including aspirin or Coumadin if these were discontinued prior to surgery. You may shower, bathe, shave, or wash your hair. Tylenol may be taken for mild discomfort.  Call your doctor if you experience significant pain, nausea, or vomiting, fever > 101 or other signs of infection. [phone] or [phone] Specific instructions:  Follow-up Information    Luetta Piazza, Remo Lipps, MD Follow up.   Specialty:  Ophthalmology Why:  May 24 at 10:40am Contact information: [address]   [place] [address] [phone]

## 2016-11-26 NOTE — Anesthesia Procedure Notes (Signed)
Procedure Name: MAC Date/Time: 11/26/2016 8:50 AM Performed by: Allean Found Pre-anesthesia Checklist: Patient identified, Emergency Drugs available, Suction available, Patient being monitored and Timeout performed Oxygen Delivery Method: Nasal cannula Placement Confirmation: positive ETCO2

## 2016-11-26 NOTE — Interval H&P Note (Signed)
History and Physical Interval Note:  11/26/2016 7:28 AM  Juan Arnold  has presented today for surgery, with the diagnosis of cataract  The various methods of treatment have been discussed with the patient and family. After consideration of risks, benefits and other options for treatment, the patient has consented to  Procedure(s): CATARACT EXTRACTION PHACO AND INTRAOCULAR LENS PLACEMENT ([name]) (Right) as a surgical intervention .  The patient's history has been reviewed, patient examined, no change in status, stable for surgery.  I have reviewed the patient's chart and labs.  Questions were answered to the patient's satisfaction.     [name]

## 2016-11-26 NOTE — Transfer of Care (Signed)
Immediate Anesthesia Transfer of Care Note  Patient: Juan Arnold  Procedure(s) Performed: Procedure(s) with comments: CATARACT EXTRACTION PHACO AND INTRAOCULAR LENS PLACEMENT (IOC) (Right) - [place] 1:36.9 AP% 22.2 CDE 33.97 FLUID PACK LOT # [id number] H  Patient Location: PACU  Anesthesia Type:MAC  Level of Consciousness: awake  Airway & Oxygen Therapy: Patient Spontanous Breathing  Post-op Assessment: Report given to RN  Post vital signs: Reviewed and stable  Last Vitals:  Vitals:   11/26/16 0652 11/26/16 0930  BP: (!) 151/74 134/62  Pulse: 60 (!) 53  Resp:  16  Temp: (!) 35.6 C 36.5 C    Last Pain:  Vitals:   11/26/16 0652  TempSrc: Tympanic         Complications: No apparent anesthesia complications

## 2020-05-08 ENCOUNTER — Other Ambulatory Visit: Payer: Self-pay | Admitting: Surgery

## 2020-05-08 DIAGNOSIS — K432 Incisional hernia without obstruction or gangrene: Secondary | ICD-10-CM

## 2020-05-18 ENCOUNTER — Other Ambulatory Visit: Payer: Self-pay | Admitting: Otolaryngology

## 2020-05-22 LAB — SURGICAL PATHOLOGY

## 2020-05-23 ENCOUNTER — Other Ambulatory Visit: Payer: Self-pay

## 2020-05-23 ENCOUNTER — Ambulatory Visit
Admission: RE | Admit: 2020-05-23 | Discharge: 2020-05-23 | Disposition: A | Discharge status: Home / Self Care | Payer: Medicare Other | Source: Ambulatory Visit | Attending: Surgery | Admitting: Surgery

## 2020-05-23 DIAGNOSIS — K432 Incisional hernia without obstruction or gangrene: Secondary | ICD-10-CM | POA: Insufficient documentation

## 2021-05-06 DIAGNOSIS — I38 Endocarditis, valve unspecified: Secondary | ICD-10-CM

## 2021-05-06 HISTORY — DX: Endocarditis, valve unspecified: I38

## 2021-07-23 ENCOUNTER — Encounter: Payer: Self-pay | Admitting: Cardiology

## 2021-07-23 ENCOUNTER — Other Ambulatory Visit: Payer: Self-pay

## 2021-07-23 ENCOUNTER — Ambulatory Visit
Admission: RE | Admit: 2021-07-23 | Discharge: 2021-07-23 | Disposition: A | Discharge status: Home / Self Care | Payer: Medicare Other | Source: Ambulatory Visit | Attending: Cardiology | Admitting: Cardiology

## 2021-07-23 ENCOUNTER — Encounter
Admission: RE | Disposition: A | Discharge status: Home / Self Care | Payer: Self-pay | Source: Ambulatory Visit | Attending: Cardiology

## 2021-07-23 DIAGNOSIS — I2510 Atherosclerotic heart disease of native coronary artery without angina pectoris: Secondary | ICD-10-CM | POA: Insufficient documentation

## 2021-07-23 DIAGNOSIS — R0602 Shortness of breath: Secondary | ICD-10-CM | POA: Insufficient documentation

## 2021-07-23 DIAGNOSIS — I340 Nonrheumatic mitral (valve) insufficiency: Secondary | ICD-10-CM

## 2021-07-23 HISTORY — DX: Atherosclerotic heart disease of native coronary artery without angina pectoris: I25.10

## 2021-07-23 HISTORY — PX: LEFT HEART CATH AND CORONARY ANGIOGRAPHY: CATH118249

## 2021-07-23 HISTORY — DX: Cardiac murmur, unspecified: R01.1

## 2021-07-23 SURGERY — LEFT HEART CATH AND CORONARY ANGIOGRAPHY
Anesthesia: Moderate Sedation

## 2021-07-23 MED ORDER — IOHEXOL 300 MG/ML  SOLN
INTRAMUSCULAR | Status: DC | PRN
  Administered 2021-07-23: 108 mL

## 2021-07-23 MED ORDER — SODIUM BICARBONATE BOLUS VIA INFUSION
INTRAVENOUS | Status: AC
  Administered 2021-07-23: 1 meq via INTRAVENOUS
  Filled 2021-07-23 (×3): qty 1

## 2021-07-23 MED ORDER — SODIUM CHLORIDE 0.9% FLUSH: 3.0000 mL | Freq: Two times a day (BID) | INTRAVENOUS | Status: DC

## 2021-07-23 MED ORDER — HEPARIN (PORCINE) IN NACL 1000-0.9 UT/500ML-% IV SOLN
INTRAVENOUS | Status: DC | PRN
  Administered 2021-07-23: 1000 mL

## 2021-07-23 MED ORDER — ACETAMINOPHEN 325 MG PO TABS: 650.0000 mg | ORAL_TABLET | ORAL | Status: DC | PRN

## 2021-07-23 MED ORDER — SODIUM CHLORIDE 0.9 % IV SOLN: 250.0000 mL | INTRAVENOUS | Status: DC | PRN

## 2021-07-23 MED ORDER — LABETALOL HCL 5 MG/ML IV SOLN: 10.0000 mg | INTRAVENOUS | Status: DC | PRN

## 2021-07-23 MED ORDER — ASPIRIN 81 MG PO CHEW: 81.0000 mg | CHEWABLE_TABLET | ORAL | Status: AC

## 2021-07-23 MED ORDER — SODIUM CHLORIDE 0.9% FLUSH: 3.0000 mL | INTRAVENOUS | Status: DC | PRN

## 2021-07-23 MED ORDER — HEPARIN SODIUM (PORCINE) 1000 UNIT/ML IJ SOLN
INTRAMUSCULAR | Status: DC | PRN
  Administered 2021-07-23: 5000 [IU] via INTRAVENOUS

## 2021-07-23 MED ORDER — MIDAZOLAM HCL 2 MG/2ML IJ SOLN
INTRAMUSCULAR | Status: DC | PRN
  Administered 2021-07-23: 1 mg via INTRAVENOUS

## 2021-07-23 MED ORDER — HYDRALAZINE HCL 20 MG/ML IJ SOLN: 10.0000 mg | INTRAMUSCULAR | Status: DC | PRN

## 2021-07-23 MED ORDER — SODIUM CHLORIDE 0.9 % WEIGHT BASED INFUSION: 1.0000 mL/kg/h | INTRAVENOUS | Status: DC

## 2021-07-23 MED ORDER — HEPARIN (PORCINE) IN NACL 1000-0.9 UT/500ML-% IV SOLN
INTRAVENOUS | Status: AC
  Filled 2021-07-23: qty 1000

## 2021-07-23 MED ORDER — HEPARIN SODIUM (PORCINE) 1000 UNIT/ML IJ SOLN
INTRAMUSCULAR | Status: AC
  Filled 2021-07-23: qty 10

## 2021-07-23 MED ORDER — ASPIRIN 81 MG PO CHEW
CHEWABLE_TABLET | ORAL | Status: AC
  Administered 2021-07-23: 81 mg via ORAL
  Filled 2021-07-23: qty 1

## 2021-07-23 MED ORDER — VERAPAMIL HCL 2.5 MG/ML IV SOLN
INTRAVENOUS | Status: AC
  Filled 2021-07-23: qty 2

## 2021-07-23 MED ORDER — MIDAZOLAM HCL 2 MG/2ML IJ SOLN
INTRAMUSCULAR | Status: AC
  Filled 2021-07-23: qty 2

## 2021-07-23 MED ORDER — ONDANSETRON HCL 4 MG/2ML IJ SOLN: 4.0000 mg | Freq: Four times a day (QID) | INTRAMUSCULAR | Status: DC | PRN

## 2021-07-23 MED ORDER — VERAPAMIL HCL 2.5 MG/ML IV SOLN
INTRAVENOUS | Status: DC | PRN
  Administered 2021-07-23: 2.5 mg via INTRA_ARTERIAL

## 2021-07-23 MED ORDER — SODIUM BICARBONATE 8.4 % IV SOLN
INTRAVENOUS | Status: AC
  Filled 2021-07-23: qty 150

## 2021-07-23 MED ORDER — SODIUM CHLORIDE 0.9 % IV SOLN: INTRAVENOUS | Status: DC

## 2021-07-23 MED ORDER — FENTANYL CITRATE (PF) 100 MCG/2ML IJ SOLN
INTRAMUSCULAR | Status: AC
  Filled 2021-07-23: qty 2

## 2021-07-23 MED ORDER — FENTANYL CITRATE (PF) 100 MCG/2ML IJ SOLN
INTRAMUSCULAR | Status: DC | PRN
  Administered 2021-07-23: 50 ug via INTRAVENOUS
  Administered 2021-07-23: 25 ug via INTRAVENOUS

## 2021-07-23 SURGICAL SUPPLY — 14 items
CATH 5F 110X4 TIG (CATHETERS) ×1 IMPLANT
CATH BALLN WEDGE 5F 110CM (CATHETERS) ×1 IMPLANT
CATH INFINITI 5FR ANG PIGTAIL (CATHETERS) ×1 IMPLANT
DEVICE RAD TR BAND REGULAR (VASCULAR PRODUCTS) ×1 IMPLANT
DRAPE BRACHIAL (DRAPES) ×2 IMPLANT
GLIDESHEATH SLEND SS 6F .021 (SHEATH) ×1 IMPLANT
GUIDEWIRE EMER 3M J .025X150CM (WIRE) ×1 IMPLANT
GUIDEWIRE INQWIRE 1.5J.035X260 (WIRE) IMPLANT
INQWIRE 1.5J .035X260CM (WIRE) ×2
PACK CARDIAC CATH (CUSTOM PROCEDURE TRAY) ×3 IMPLANT
PROTECTION STATION PRESSURIZED (MISCELLANEOUS) ×2
SET ATX SIMPLICITY (MISCELLANEOUS) ×1 IMPLANT
SHEATH GLIDE SLENDER 4/5FR (SHEATH) ×1 IMPLANT
STATION PROTECTION PRESSURIZED (MISCELLANEOUS) IMPLANT

## 2021-07-24 ENCOUNTER — Encounter: Payer: Self-pay | Admitting: Cardiology

## 2021-08-06 ENCOUNTER — Other Ambulatory Visit: Payer: Self-pay | Admitting: Nephrology

## 2021-08-06 DIAGNOSIS — N1831 Chronic kidney disease, stage 3a: Secondary | ICD-10-CM

## 2021-08-06 DIAGNOSIS — N281 Cyst of kidney, acquired: Secondary | ICD-10-CM

## 2021-08-13 ENCOUNTER — Ambulatory Visit
Admission: RE | Admit: 2021-08-13 | Discharge: 2021-08-13 | Disposition: A | Discharge status: Home / Self Care | Payer: Medicare Other | Source: Ambulatory Visit | Attending: Nephrology | Admitting: Nephrology

## 2021-08-13 ENCOUNTER — Other Ambulatory Visit: Payer: Self-pay

## 2021-08-13 DIAGNOSIS — N1831 Chronic kidney disease, stage 3a: Secondary | ICD-10-CM | POA: Diagnosis present

## 2021-08-13 DIAGNOSIS — N281 Cyst of kidney, acquired: Secondary | ICD-10-CM | POA: Insufficient documentation

## 2022-11-13 ENCOUNTER — Other Ambulatory Visit: Payer: Self-pay

## 2022-11-13 DIAGNOSIS — R634 Abnormal weight loss: Secondary | ICD-10-CM

## 2022-11-13 DIAGNOSIS — Z85038 Personal history of other malignant neoplasm of large intestine: Secondary | ICD-10-CM

## 2022-11-13 DIAGNOSIS — N183 Chronic kidney disease, stage 3 unspecified: Secondary | ICD-10-CM

## 2022-11-26 ENCOUNTER — Ambulatory Visit
Admission: RE | Admit: 2022-11-26 | Discharge: 2022-11-26 | Disposition: A | Discharge status: Home / Self Care | Payer: Medicare Other | Source: Ambulatory Visit | Attending: Internal Medicine | Admitting: Internal Medicine

## 2022-11-26 DIAGNOSIS — E1122 Type 2 diabetes mellitus with diabetic chronic kidney disease: Secondary | ICD-10-CM | POA: Insufficient documentation

## 2022-11-26 DIAGNOSIS — R634 Abnormal weight loss: Secondary | ICD-10-CM | POA: Insufficient documentation

## 2022-11-26 DIAGNOSIS — Z85038 Personal history of other malignant neoplasm of large intestine: Secondary | ICD-10-CM | POA: Insufficient documentation

## 2022-11-26 DIAGNOSIS — N183 Chronic kidney disease, stage 3 unspecified: Secondary | ICD-10-CM | POA: Diagnosis present

## 2022-11-26 MED ORDER — IOHEXOL 300 MG/ML  SOLN
100.0000 mL | Freq: Once | INTRAMUSCULAR | Status: AC | PRN
  Administered 2022-11-26: 100 mL via INTRAVENOUS

## 2022-12-16 ENCOUNTER — Other Ambulatory Visit
Admission: RE | Admit: 2022-12-16 | Discharge: 2022-12-16 | Disposition: A | Discharge status: Home / Self Care | Payer: Medicare Other | Source: Ambulatory Visit | Attending: Internal Medicine | Admitting: Internal Medicine

## 2022-12-16 DIAGNOSIS — N183 Chronic kidney disease, stage 3 unspecified: Secondary | ICD-10-CM | POA: Insufficient documentation

## 2022-12-16 DIAGNOSIS — R000 Tachycardia, unspecified: Secondary | ICD-10-CM | POA: Insufficient documentation

## 2022-12-16 DIAGNOSIS — E1122 Type 2 diabetes mellitus with diabetic chronic kidney disease: Secondary | ICD-10-CM | POA: Diagnosis present

## 2022-12-16 LAB — D-DIMER, QUANTITATIVE: D-Dimer, Quant: 1.42 ug/mL-FEU — ABNORMAL HIGH (ref 0.00–0.50)

## 2022-12-16 LAB — TROPONIN I (HIGH SENSITIVITY): Troponin I (High Sensitivity): 26 ng/L — ABNORMAL HIGH (ref <18)

## 2022-12-17 ENCOUNTER — Other Ambulatory Visit: Payer: Self-pay | Admitting: Internal Medicine

## 2022-12-17 ENCOUNTER — Ambulatory Visit
Admission: RE | Admit: 2022-12-17 | Discharge: 2022-12-17 | Disposition: A | Discharge status: Home / Self Care | Payer: Medicare Other | Source: Ambulatory Visit | Attending: Internal Medicine | Admitting: Internal Medicine

## 2022-12-17 DIAGNOSIS — R000 Tachycardia, unspecified: Secondary | ICD-10-CM

## 2022-12-17 DIAGNOSIS — R7989 Other specified abnormal findings of blood chemistry: Secondary | ICD-10-CM

## 2022-12-17 MED ORDER — IOHEXOL 350 MG/ML SOLN
75.0000 mL | Freq: Once | INTRAVENOUS | Status: AC | PRN
  Administered 2022-12-17: 75 mL via INTRAVENOUS

## 2023-02-19 ENCOUNTER — Other Ambulatory Visit: Payer: Self-pay | Admitting: Internal Medicine

## 2023-02-19 ENCOUNTER — Ambulatory Visit
Admission: RE | Admit: 2023-02-19 | Discharge: 2023-02-19 | Disposition: A | Discharge status: Home / Self Care | Payer: Medicare Other | Source: Ambulatory Visit | Attending: Internal Medicine | Admitting: Internal Medicine

## 2023-02-19 DIAGNOSIS — R600 Localized edema: Secondary | ICD-10-CM | POA: Insufficient documentation

## 2023-02-19 DIAGNOSIS — I82412 Acute embolism and thrombosis of left femoral vein: Secondary | ICD-10-CM

## 2023-02-19 HISTORY — DX: Acute embolism and thrombosis of left femoral vein: I82.412

## 2023-03-17 ENCOUNTER — Ambulatory Visit: Payer: Self-pay | Admitting: Surgery

## 2023-03-17 NOTE — H&P (Signed)
Subjective:   CC: Non-recurrent bilateral inguinal hernia without obstruction or gangrene [K40.20]  HPI:  Juan Arnold is a 88 y.o. male who was referred by Joesphine Bare* for evaluation of above. Symptoms were first noted a few months ago. Pain is dull, intermittent, and bilateral groin.  Associated with lump, exacerbated by exertion.  Lump is reducible.    Past Medical History:  has a past medical history of Cancer (CMS/HHS-HCC), Heart murmur, Hyperlipidemia, Hypertension, Peptic ulcer disease, and Sinus bradycardia.  Past Surgical History:  Past Surgical History:  Procedure Laterality Date   APPENDECTOMY  1954   REPAIR INGUINAL HERNIA  1955   spinal cyst excision  1956   CARDIAC CATHETERIZATION  05/14/2006   insignificant CAD   COLECTOMY PARTIAL ABDOMINAL & TRANSANAL      Family History: family history includes Cancer in an other family member; Kidney disease in an other family member.  Social History:  reports that he has quit smoking. His smoking use included cigarettes. He has quit using smokeless tobacco. He reports that he does not drink alcohol and does not use drugs.  Current Medications: has a current medication list which includes the following prescription(s): apixaban, aspirin, cholestyramine, metoprolol succinate, rosuvastatin, and tramadol.  Allergies:  Allergies as of 03/17/2023 - Reviewed 03/17/2023  Allergen Reaction Noted   Ointment,white (obsolete) Unknown 07/23/2021    ROS:  A 15 point review of systems was performed and pertinent positives and negatives noted in HPI   Objective:     BP 134/64   Pulse (!) 40   Ht 182 cm (5' 11.65")   Wt 82.9 kg (182 lb 12.2 oz)   BMI 25.03 kg/m   Constitutional :  Alert, cooperative, no distress  Lymphatics/Throat:  Supple, no lymphadenopathy  Respiratory:  clear to auscultation bilaterally  Cardiovascular:  regular rate and rhythm  Gastrointestinal: soft, non-tender; bowel sounds normal; no masses,   no organomegaly. inguinal hernia noted.  moderate, reducible, no overlying skin changes, and possibly bilateral, for sure left.  Musculoskeletal: Steady gait and movement  Skin: Cool and moist, low midline surgical scars  Psychiatric: Normal affect, non-agitated, not confused       LABS:  N/a   RADS: N/a Assessment:       Non-recurrent bilateral inguinal hernia without obstruction or gangrene [K40.20], left, likely right as well. Hx of repair on right side.  Hx of LAR.  Plan:     1. Non-recurrent bilateral inguinal hernia without obstruction or gangrene [K40.20]   Discussed the risk of surgery including recurrence, which can be up to 50% in the case of incisional or complex hernias, possible use of prosthetic materials (mesh) and the increased risk of mesh infxn if used, bleeding, chronic pain, post-op infxn, post-op SBO or ileus, and possible re-operation to address said risks. The risks of general anesthetic, if used, includes MI, CVA, sudden death or even reaction to anesthetic medications also discussed. Alternatives include continued observation.  Benefits include possible symptom relief, prevention of incarceration, strangulation, enlargement in size over time, and the risk of emergency surgery in the face of strangulation.   Typical post-op recovery time of 3-5 days with 2 weeks of activity restrictions were also discussed.  ED return precautions given for sudden increase in pain, size of hernia with accompanying fever, nausea, and/or vomiting.  The patient verbalized understanding and all questions were answered to the patient's satisfaction.   2. Patient has elected to proceed with surgical treatment. Procedure will be scheduled. bilateral, robotic  assisted laparoscopic.   Hold eliquis 3 days prior if safe to do so.  Medical risk stratification with cardiology and primary  labs/images/medications/previous chart entries reviewed personally and relevant changes/updates noted  above.

## 2023-03-20 ENCOUNTER — Encounter
Admission: RE | Admit: 2023-03-20 | Discharge: 2023-03-20 | Disposition: A | Discharge status: Home / Self Care | Payer: Medicare Other | Source: Ambulatory Visit | Attending: Surgery | Admitting: Surgery

## 2023-03-20 ENCOUNTER — Other Ambulatory Visit: Payer: Self-pay

## 2023-03-20 HISTORY — DX: Acute embolism and thrombosis of unspecified deep veins of unspecified lower extremity: I82.409

## 2023-03-20 HISTORY — DX: Bradycardia, unspecified: R00.1

## 2023-03-20 HISTORY — DX: Chronic kidney disease, stage 3 unspecified: N18.30

## 2023-03-20 HISTORY — DX: Essential (primary) hypertension: I10

## 2023-03-20 HISTORY — DX: Peptic ulcer, site unspecified, unspecified as acute or chronic, without hemorrhage or perforation: K27.9

## 2023-03-20 HISTORY — DX: Prediabetes: R73.03

## 2023-03-20 HISTORY — DX: Hyperlipidemia, unspecified: E78.5

## 2023-03-20 NOTE — Patient Instructions (Addendum)
Your procedure is scheduled on: Thursday 03/26/23 To find out your arrival time, please call [phone] between 1PM - 3PM on:   Wednesday 03/25/23 Report to the Registration Desk on the 1st floor of the [facility]. FREE Valet parking is available.  If your arrival time is 6:00 am, do not arrive before that time as the [facility] entrance doors do not open until 6:00 am.  REMEMBER: Instructions that are not followed completely may result in serious medical risk, up to and including death; or upon the discretion of your surgeon and anesthesiologist your surgery may need to be rescheduled.  Do not eat food after midnight the night before surgery.  No gum chewing or hard candies.  You may however, drink CLEAR liquids up to 2 hours before you are scheduled to arrive for your surgery. Do not drink anything within 2 hours of your scheduled arrival time.  Clear liquids include: - water  - apple juice without pulp - gatorade (not RED colors) - black coffee or tea (Do NOT add milk or creamers to the coffee or tea) Do NOT drink anything that is not on this list.  Type 1 and Type 2 diabetics should only drink water.  One week prior to surgery: Stop Anti-inflammatories (NSAIDS) such as Advil, Aleve, Ibuprofen, Motrin, Naproxen, Naprosyn and Aspirin based products such as Excedrin, Goody's Powder, BC Powder. You may however, continue to take Tylenol if needed for pain up until the day of surgery.  Stop ANY OVER THE COUNTER supplements until after surgery.  Continue taking all prescribed medications with the exception of the following: Stop your Eliquis as instructed.  TAKE ONLY THESE MEDICATIONS THE MORNING OF SURGERY WITH A SIP OF WATER:  none  No Alcohol for 24 hours before or after surgery.  No Smoking including e-cigarettes for 24 hours before surgery.  No chewable tobacco products for at least 6 hours before surgery.  No nicotine patches on the day of surgery.  Do not use any  "recreational" drugs for at least a week (preferably 2 weeks) before your surgery.  Please be advised that the combination of cocaine and anesthesia may have negative outcomes, up to and including death. If you test positive for cocaine, your surgery will be cancelled.  On the morning of surgery brush your teeth with toothpaste and water, you may rinse your mouth with mouthwash if you wish. Do not swallow any toothpaste or mouthwash.  Use CHG Soap or wipes as directed on instruction sheet.  Do not wear lotions, powders, or perfumes.   Do not shave body hair from the neck down 48 hours before surgery.  Wear comfortable clothing (specific to your surgery type) to the hospital.  Do not wear jewelry, make-up, hairpins, clips or nail polish.  Contact lenses, hearing aids and dentures may not be worn into surgery.  Do not bring valuables to the hospital. [facility] is not responsible for any missing/lost belongings or valuables.   Notify your doctor if there is any change in your medical condition (cold, fever, infection).  If you are being discharged the day of surgery, you will not be allowed to drive home. You will need a responsible individual to drive you home and stay with you for 24 hours after surgery.   If you are taking public transportation, you will need to have a responsible individual with you.  If you are being admitted to the hospital overnight, leave your suitcase in the car. After surgery it may be  brought to your room.  In case of increased patient census, it may be necessary for you, the patient, to continue your postoperative care in the Same Day Surgery department.  After surgery, you can help prevent lung complications by doing breathing exercises.  Take deep breaths and cough every 1-2 hours. Your doctor may order a device called an Incentive Spirometer to help you take deep breaths. When coughing or sneezing, hold a pillow firmly against your incision with both  hands. This is called "splinting." Doing this helps protect your incision. It also decreases belly discomfort.  Surgery Visitation Policy:  Patients undergoing a surgery or procedure may have two family members or support persons with them as long as the person is not COVID-19 positive or experiencing its symptoms.   Inpatient Visitation:    Visiting hours are 7 a.m. to 8 p.m. Up to four visitors are allowed at one time in a patient room. The visitors may rotate out with other people during the day. One designated support person (adult) may remain overnight.  Please call the Pre-admissions Testing Dept. at [phone] if you have any questions about these instructions.     Preparing for Surgery with CHLORHEXIDINE GLUCONATE (CHG) Soap  Chlorhexidine Gluconate (CHG) Soap  o An antiseptic cleaner that kills germs and bonds with the skin to continue killing germs even after washing  o Used for showering the night before surgery and morning of surgery  Before surgery, you can play an important role by reducing the number of germs on your skin.  CHG (Chlorhexidine gluconate) soap is an antiseptic cleanser which kills germs and bonds with the skin to continue killing germs even after washing.  Please do not use if you have an allergy to CHG or antibacterial soaps. If your skin becomes reddened/irritated stop using the CHG.  1. Shower the NIGHT BEFORE SURGERY and the MORNING OF SURGERY with CHG soap.  2. If you choose to wash your hair, wash your hair first as usual with your normal shampoo.  3. After shampooing, rinse your hair and body thoroughly to remove the shampoo.  4. Use CHG as you would any other liquid soap. You can apply CHG directly to the skin and wash gently with a scrungie or a clean washcloth.  5. Apply the CHG soap to your body only from the neck down. Do not use on open wounds or open sores. Avoid contact with your eyes, ears, mouth, and genitals (private parts).  Wash face and genitals (private parts) with your normal soap.  6. Wash thoroughly, paying special attention to the area where your surgery will be performed.  7. Thoroughly rinse your body with warm water.  8. Do not shower/wash with your normal soap after using and rinsing off the CHG soap.  9. Pat yourself dry with a clean towel.  10. Wear clean pajamas to bed the night before surgery.  12. Place clean sheets on your bed the night of your first shower and do not sleep with pets.  13. Shower again with the CHG soap on the day of surgery prior to arriving at the hospital.  14. Do not apply any deodorants/lotions/powders.  15. Please wear clean clothes to the hospital.

## 2023-03-25 ENCOUNTER — Encounter: Payer: Self-pay | Admitting: Surgery

## 2023-03-25 NOTE — Progress Notes (Signed)
Perioperative / Anesthesia Services  Pre-Admission Testing Clinical Review / Pre-Operative Anesthesia Consult  Date: 03/25/23  Patient Demographics:  Name: Juan Arnold DOB:   02-09-1935 MRN:   [id number]  Planned Surgical Procedure(s):    Case: [id number] Date/Time: 04/23/23 1024   Procedure: XI ROBOTIC ASSISTED INGUINAL HERNIA w/ mesh (Bilateral: Inguinal)   Anesthesia type: General   Pre-op diagnosis: non-recurrent bilateral inguinal hernia w/o obstruction or gangrene K40.20   Location: [facility] OR ROOM 04 / [facility]S FOR AN[facility]   Surgeons: Sung Amabile, DO     NOTE: Available PAT nursing documentation and vital signs have been reviewed. Clinical nursing staff has updated patient's PMH/PSHx, current medication list, and drug allergies/intolerances to ensure comprehensive history available to assist in medical decision making as it pertains to the aforementioned surgical procedure and anticipated anesthetic course. Extensive review of available clinical information personally performed. [place] PMH and PSHx updated with any diagnoses/procedures that  may have been inadvertently omitted during his intake with the pre-admission testing department's nursing staff.  Clinical Discussion:  Juan Arnold is a 88 y.o. male who is submitted for pre-surgical anesthesia review and clearance prior to him undergoing the above procedure. Patient is a Former Smoker (quit 07/1993). Pertinent PMH includes: CAD, valvular insufficiency, SA node dysfunction/sinus bradycardia, aortic atherosclerosis, palpitations, RBBB, cardiac murmur, DVT (on apixaban), HLD, prediabetes, dyspnea, secondary hyperparathyroidism, CKD-III, GERD (no daily Tx), PUD, peripheral edema, remote colon cancer, BILATERAL inguinal hernias.  Patient is followed by cardiology Darrold Junker, MD). He was last seen in the cardiology clinic on 12/10/2022; notes reviewed. At the time of his clinic visit, patient doing well overall from a  cardiovascular perspective.  Patient with complaints of chronic exertional dyspnea that was reported to be stable and at baseline.  He was also reporting occasional episodes of palpitations and lightheadedness.  Patient denied any chest pain, PND, orthopnea, significant peripheral edema, weakness, fatigue, vertiginous symptoms, or presyncope/syncope. Patient with a past medical history significant for cardiovascular diagnoses. Documented physical exam was grossly benign, providing no evidence of acute exacerbation and/or decompensation of the patient's known cardiovascular conditions.  TTE was performed on 05/06/2021 revealing normal left ventricular systolic function with an EF of >55%.  There was mild LVH.  There were no regional wall motion abnormalities.  There was severe LEFT and moderate RIGHT atrial enlargement, in addition to mild RIGHT ventricular enlargement.  There was trivial aortic, mild pulmonic, moderate tricuspid, and severe mitral valve regurgitation noted. RVSP elevated at 71.8 mmHg; secondary to valvular insufficiencies. All transvalvular gradients were noted to be normal providing no evidence suggestive of valvular stenosis.  Aorta normal in size with no evidence of aneurysmal dilatation.  Patient underwent diagnostic LEFT heart catheterization on 07/23/2021 revealing normal left ventricular systolic function with an EF of 55 to 65%.  There was multivessel CAD; 40% mLM, 30% p-mLAD, 20% pRCA-1, 20% pRCA-2, and 30% mLAD.  Given the nonobstructive nature of her coronary artery disease, the decision was made to defer intervention opting for medical management.  Patient with confirmed LEFT calf vein DVT and superficial thrombophlebitis diagnosed by lower extremity duplex on 02/19/2023.  Patient is being chronically anticoagulated using standard dose apixaban.  Patient is reportedly compliant with therapy with no evidence or reports of GI/GU related bleeding.  Blood pressure well-controlled on  currently prescribed beta-blocker (metoprolol succinate) monotherapy.  Patient is on rosuvastatin for his HLD diagnosis and further ASCVD prevention.  He has a prediabetes diagnosis; last HgbA1c was 6.2% when checked on  11/04/2022.  Patient active per baseline.  He is able to participate in pleasure activities such as yard work and fishing.  He is able to complete all of his ADLs/IADLs independently without significant cardiovascular limitation.  Per the [facility], patient able to complete >4 METS of physical activity without experiencing any significant degree of angina/anginal equivalent symptoms.  No changes were made to his medication regimen.  Patient to follow-up with outpatient cardiology in 6 months or sooner if needed.  Juan Arnold is scheduled for an elective XI ROBOTIC ASSISTED INGUINAL HERNIA w/ mesh (Bilateral: Inguinal) on 03/26/2023 with Dr. Sung Amabile, DO.  Given patient's past medical history significant for cardiovascular diagnoses, presurgical cardiac clearance was sought by the PAT team. Per cardiology, "this patient is optimized for surgery and may proceed with the planned procedural course with a LOW risk of significant perioperative cardiovascular complications".    Again, this patient is on daily oral anticoagulation therapy using a DOAC medication.  He has been instructed on recommendations for holding his apixaban for 3 days prior to his procedure with plans to restart as soon as postoperative bleeding risk felt to be minimized by his attending surgeon. The patient has been instructed that his last dose of his apixaban should be on 03/22/2023.  Patient denies previous perioperative complications with anesthesia in the past. In review of the available records, it is noted that patient underwent a MAC anesthetic course here at [facility] in 11/2016 without documented complications.      03/20/2023    4:08 PM 07/23/2021    4:49 PM 07/23/2021     4:00 PM  Vitals with BMI  Height 6\' 0"     Weight 182 lbs    BMI 24.68    Systolic  149 153  Diastolic  67 66  Pulse  44 49    Providers/Specialists:   NOTE: Primary physician provider listed below. Patient may have been seen by APP or partner within same practice.   PROVIDER ROLE / SPECIALTY LAST Michelle Nasuti, DO General Surgery (Surgeon) 03/17/2023  Lauro Regulus, MD Primary Care Provider 03/10/2023  Marcina Millard, MD Cardiology 12/10/2022  Mosetta Pigeon, MD Nephrology 02/17/2022   Allergies:  Plasticized base [plastibase]  Current Home Medications:   No current facility-administered medications for this encounter.    acetaminophen (TYLENOL) 650 MG CR tablet   apixaban (ELIQUIS) 5 MG TABS tablet   cholestyramine (QUESTRAN) 4 g packet   metoprolol succinate (TOPROL-XL) 25 MG 24 hr tablet   rosuvastatin (CRESTOR) 20 MG tablet   traMADol (ULTRAM) 50 MG tablet   History:   Past Medical History:  Diagnosis Date   Aortic atherosclerosis (HCC)    Bilateral inguinal hernia    Bradycardia    CAD (coronary artery disease) 07/23/2021   a.) LHC 07/23/2021: 40% mLM, 30% p-mLAD, 20/20% pRCA, 30% mRCA - med mgmt   CKD (chronic kidney disease) stage 3, GFR 30-59 ml/min (HCC)    Colon cancer (HCC)    a.) s/p partial colectomy (LAR); "they removed 14-17 inches of my colon"   DVT femoral (deep venous thrombosis) with thrombophlebitis, left (HCC) 02/19/2023   a.) lower extremity duplex 02/19/2023: LEFT calf DVT and superficial venous thrombophlebitis   Dyspnea    Edema    GERD (gastroesophageal reflux disease)    Heart murmur    History of bilateral cataract extraction 2018   HLD (hyperlipidemia)    HOH; uses BILATERAL hearing aids  Hypertension    IBS (irritable bowel syndrome)    Non-traumatic compression fracture of L3 lumbar vertebra (HCC)    On apixaban therapy    Palpitations (PVC/PACs)    Pre-diabetes    PUD (peptic ulcer disease)    RBBB (right  bundle branch block)    Renal cyst    SA node dysfunction (HCC)    Secondary hyperparathyroidism (HCC)    Valvular insufficiency 05/06/2021   a.) TTE 05/06/2021: EF >55%, mild LVH, sev LAE, mod RAE, triv AR, mod PR, mod-sev TR, sev MR, RVSP 71.8   Past Surgical History:  Procedure Laterality Date   APPENDECTOMY  1955   ruptured!   CATARACT EXTRACTION W/PHACO Left 10/29/2016   Procedure: CATARACT EXTRACTION PHACO AND INTRAOCULAR LENS PLACEMENT (IOC);  Surgeon: Sallee Lange, MD;  Location: [facility] ORS;  Service: Ophthalmology;  Laterality: Left;  [place] 2:05.4 AP% 25.3 CDE 49.50 FLUID PACK LOT # [id number]   CATARACT EXTRACTION W/PHACO Right 11/26/2016   Procedure: CATARACT EXTRACTION PHACO AND INTRAOCULAR LENS PLACEMENT (IOC);  Surgeon: Sallee Lange, MD;  Location: [facility] ORS;  Service: Ophthalmology;  Laterality: Right;  [place] 1:36.9 AP% 22.2 CDE 33.97 FLUID PACK LOT # [id number]   COLECTOMY     INGUINAL HERNIA REPAIR Right 1954   LEFT HEART CATH AND CORONARY ANGIOGRAPHY N/A 07/23/2021   Procedure: LEFT HEART CATH AND CORONARY ANGIOGRAPHY;  Surgeon: Marcina Millard, MD;  Location: [facility] INVASIVE CV LAB;  Service: Cardiovascular;  Laterality: N/A;   PARTIAL COLECTOMY  2006   PILONIDAL CYST EXCISION     No family history on file. Social History   Tobacco Use   Smoking status: Former    Current packs/day: 0.00    Types: Cigarettes    Quit date: 07/07/1993    Years since quitting: 29.7   Smokeless tobacco: Never  Vaping Use   Vaping status: Never Used  Substance Use Topics   Alcohol use: No   Drug use: Never    Pertinent Clinical Results:  LABS:   Component Ref Range & Units 03/10/2023  WBC (White Blood Cell Count) 4.1 - 10.2 10^3/uL 7.3  RBC (Red Blood Cell Count) 4.69 - 6.13 10^6/uL 3.90 Low   Hemoglobin 14.1 - 18.1 gm/dL 01.0 Low   Hematocrit 27.2 - 52.0 % 39.2 Low   MCV (Mean Corpuscular Volume) 80.0 - 100.0 fl 100.5 High   MCH (Mean Corpuscular  Hemoglobin) 27.0 - 31.2 pg 32.8 High   MCHC (Mean Corpuscular Hemoglobin Concentration) 32.0 - 36.0 gm/dL 53.6  Platelet Count 644 - 450 10^3/uL 243  RDW-CV (Red Cell Distribution Width) 11.6 - 14.8 % 13.2  MPV (Mean Platelet Volume) 9.4 - 12.4 fl 10.3  Neutrophils 1.50 - 7.80 10^3/uL 4.86  Lymphocytes 1.00 - 3.60 10^3/uL 1.65  Monocytes 0.00 - 1.50 10^3/uL 0.67  Eosinophils 0.00 - 0.55 10^3/uL 0.12  Basophils 0.00 - 0.09 10^3/uL 0.03  Neutrophil % 32.0 - 70.0 % 66.3  Lymphocyte % 10.0 - 50.0 % 22.5  Monocyte % 4.0 - 13.0 % 9.1  Eosinophil % 1.0 - 5.0 % 1.6  Basophil% 0.0 - 2.0 % 0.4  Immature Granulocyte % <=0.7 % 0.1  Immature Granulocyte Count <=0.06 10^3/L 0.01  Resulting Agency [facility]  Specimen Collected: 03/10/23 16:19   Performed by: [facility] Last Resulted: 03/10/23 17:06  Received From: [facility]  Result Received: 03/17/23 11:00   Component Ref Range & Units 03/10/2023  Glucose 70 - 110 mg/dL 034  Sodium 136 - 145 mmol/L 143  Potassium 3.6 - 5.1 mmol/L 4.6  Chloride 97 - 109 mmol/L 112 High   Carbon Dioxide (CO2) 22.0 - 32.0 mmol/L 25.0  Urea Nitrogen (BUN) 7 - 25 mg/dL 34 High   Creatinine 0.7 - 1.3 mg/dL 1.6 High   Glomerular Filtration Rate (eGFR) >60 mL/min/1.73sq m 42 Low   Calcium 8.7 - 10.3 mg/dL 8.6 Low   AST 8 - 39 U/L 31  ALT 6 - 57 U/L 28  Alk Phos (alkaline Phosphatase) 34 - 104 U/L 84  Albumin 3.5 - 4.8 g/dL 4.0  Bilirubin, Total 0.3 - 1.2 mg/dL 0.3  Protein, Total 6.1 - 7.9 g/dL 6.6  A/G Ratio 1.0 - 5.0 gm/dL 1.5  Resulting Agency [facility] - LAB  Specimen Collected: 03/10/23 16:19   Performed by: [facility] Last Resulted: 03/10/23 17:44  Received From: [facility]  Result Received: 03/17/23 11:00    ECG: Date: 12/16/2022 Time ECG obtained: 1016 AM Rate: 113 bpm Rhythm: Wide-complex rhythm with occasional PVCs;  RBBB Axis (leads I and aVF): Normal Intervals: QRS 134 ms. QTc 526 ms. ST segment and T wave changes: No evidence of acute ST segment elevation or depression.  Evidence of a possible age undetermined septal infarct present. Comparison: Previous tracing obtained on 07/15/2021 demonstrated sinus bradycardia with PACs; RBBB NOTE: Tracing obtained at [facility]; unable for review. Above based on cardiologist's interpretation.    IMAGING / PROCEDURES: US VENOUS IMG LOWER UNILATERAL LEFT (DVT) performed on 02/19/2023 Positive for LEFT calf both deep venous thrombosis and superficial venous thrombophlebitis.  CT ANGIO CHEST PULMONARY EMBOLISM (PE) W OR WO CONTRAST performed on 12/17/2022 No evidence of pulmonary embolus. No acute cardiopulmonary disease. Aortic atherosclerosis  Emphysema  Right solid pulmonary nodule measuring 2 mm. Per Fleischner Society Guidelines, no routine follow-up imaging is recommended. These guidelines do not apply to immunocompromised patients and patients with cancer. Follow up in patients with significant comorbidities as clinically warranted. For lung cancer screening, adhere to Lung-RADS guidelines. Reference: Radiology. 2017; 284(1):228-43.  CT CHEST ABDOMEN PELVIS W CONTRAST performed on 11/26/2022 Prior low anterior resection without evidence of local recurrence. No evidence of metastatic disease in the chest, abdomen or pelvis. Solid 2-3 mm right lower lobe pulmonary nodule is favored benign but technically indeterminate. Suggest attention on short-term interval follow-up dedicated chest CT Mild mesorectal fat stranding and presacral soft tissue/fluid, nonspecific but favored posttreatment related. Large volume of formed stool in the colon with fecalized loops of small bowel, suggestive of constipation with slow transit. Dilated main pulmonary artery, which can be seen in the setting of pulmonary arterial hypertension. Aortic atherosclerosis   Emphysema  LEFT HEART CATHETERIZATION AND CORONARY ANGIOGRAPHY performed on 07/23/2021 Normal left ventricular systolic function with an EF of 55 to 65% Trivial mitral valve regurgitation Multivessel CAD Mid LM lesion is 40% stenosed. Prox LAD to Mid LAD lesion is 30% stenosed. Prox RCA-1 lesion is 20% stenosed. Prox RCA-2 lesion is 20% stenosed. Mid RCA lesion is 30% stenosed. Recommendations: medical therapy   TRANSTHORACIC ECHOCARDIOGRAM performed on 05/06/2021 Normal left ventricular systolic function with an EF of >55% Mild LVH No regional wall motion abnormalities Severely enlarged left atrium Moderately enlarged right atrium Mildly enlarged right ventricle Trivial AR Mild PR Moderate TR Severe MR Peak RVSP 71.8 mmHg No pericardial effusion  Impression and Plan:  Juan Arnold has been referred for pre-anesthesia review and clearance prior to him undergoing the planned  anesthetic and procedural courses. Available labs, pertinent testing, and imaging results were personally reviewed by me in preparation for upcoming operative/procedural course. [facility] medical record has been updated following extensive record review and patient interview with PAT staff.   This patient has been appropriately cleared by cardiology with an overall LOW risk of experiencing significant perioperative cardiovascular complications. Based on clinical review performed today (03/25/23), barring any significant acute changes in the patient's overall condition, it is anticipated that he will be able to proceed with the planned surgical intervention. Any acute changes in clinical condition may necessitate his procedure being postponed and/or cancelled. Patient will meet with anesthesia team (MD and/or CRNA) on the day of his procedure for preoperative evaluation/assessment. Questions regarding anesthetic course will be fielded at that time.   Pre-surgical instructions were reviewed with the patient  during his PAT appointment, and questions were fielded to satisfaction by PAT clinical staff. He has been instructed on which medications that he will need to hold prior to surgery, as well as the ones that have been deemed safe/appropriate to take on the day of his procedure. As part of the general education provided by PAT, patient made aware both verbally and in writing, that he would need to abstain from the use of any illegal substances during his perioperative course.  He was advised that failure to follow the provided instructions could necessitate case cancellation or result in serious perioperative complications up to and including death. Patient encouraged to contact PAT and/or his surgeon's office to discuss any questions or concerns that may arise prior to surgery; verbalized understanding.   Quentin Mulling, MSN, APRN, FNP-C, CEN [facility]  Perioperative Services Nurse Practitioner Phone: [phone] Fax: [phone] 03/25/23 1:10 PM  NOTE: This note has been prepared using Dragon dictation software. Despite my best ability to proofread, there is always the potential that unintentional transcriptional errors may still occur from this process.

## 2023-04-17 ENCOUNTER — Encounter
Admission: RE | Admit: 2023-04-17 | Discharge: 2023-04-17 | Disposition: A | Discharge status: Home / Self Care | Payer: Medicare Other | Source: Ambulatory Visit | Attending: Surgery | Admitting: Surgery

## 2023-04-17 NOTE — Patient Instructions (Addendum)
Your procedure is scheduled on:04-23-23 Thursday Report to the Registration Desk on the 1st floor of the [facility].Then proceed to the 2nd floor Surgery Desk To find out your arrival time, please call [phone] between 1PM - 3PM on:04-22-23 Wednesday If your arrival time is 6:00 am, do not arrive before that time as the [facility] entrance doors do not open until 6:00 am.  REMEMBER: Instructions that are not followed completely may result in serious medical risk, up to and including death; or upon the discretion of your surgeon and anesthesiologist your surgery may need to be rescheduled.  Do not eat food after midnight the night before surgery.  No gum chewing or hard candies.  You may however, drink CLEAR liquids up to 2 hours before you are scheduled to arrive for your surgery. Do not drink anything within 2 hours of your scheduled arrival time.  Clear liquids include: - water  - apple juice without pulp - gatorade (not RED colors) - black coffee or tea (Do NOT add milk or creamers to the coffee or tea) Do NOT drink anything that is not on this list.  One week prior to surgery:Last dose on 04-17-23 Friday Stop Anti-inflammatories (NSAIDS) such as Advil, Aleve, Ibuprofen, Motrin, Naproxen, Naprosyn and Aspirin based products such as Excedrin, Goody's Powder, BC Powder. Stop ANY OVER THE COUNTER supplements/vitamins NOW (04-17-23)until after surgery.  You may however, continue to take Tylenol/Tramadol if needed for pain up until the day of surgery.  Stop your apixaban (ELIQUIS) 3 days prior to surgery-Last dose will be on 04-19-23 Sunday  Continue taking all of your other prescription medications up until the day of surgery.  Do NOT take any medication the day of surgery  No Alcohol for 24 hours before or after surgery.  No Smoking including e-cigarettes for 24 hours before surgery.  No chewable tobacco products for at least 6 hours before surgery.  No nicotine patches  on the day of surgery.  Do not use any "recreational" drugs for at least a week (preferably 2 weeks) before your surgery.  Please be advised that the combination of cocaine and anesthesia may have negative outcomes, up to and including death. If you test positive for cocaine, your surgery will be cancelled.  On the morning of surgery brush your teeth with toothpaste and water, you may rinse your mouth with mouthwash if you wish. Do not swallow any toothpaste or mouthwash.  Use CHG Soap as directed on instruction sheet.  Do not wear jewelry, make-up, hairpins, clips or nail polish.  For welded (permanent) jewelry: bracelets, anklets, waist bands, etc.  Please have this removed prior to surgery.  If it is not removed, there is a chance that hospital personnel will need to cut it off on the day of surgery.  Do not wear lotions, powders, or perfumes.   Do not shave body hair from the neck down 48 hours before surgery.  Contact lenses, hearing aids and dentures may not be worn into surgery.  Do not bring valuables to the hospital. [facility] is not responsible for any missing/lost belongings or valuables.   Notify your doctor if there is any change in your medical condition (cold, fever, infection).  Wear comfortable clothing (specific to your surgery type) to the hospital.  After surgery, you can help prevent lung complications by doing breathing exercises.  Take deep breaths and cough every 1-2 hours. Your doctor may order a device called an Incentive Spirometer to help you take deep  breaths. When coughing or sneezing, hold a pillow firmly against your incision with both hands. This is called "splinting." Doing this helps protect your incision. It also decreases belly discomfort.  If you are being admitted to the hospital overnight, leave your suitcase in the car. After surgery it may be brought to your room.  In case of increased patient census, it may be necessary for you, the  patient, to continue your postoperative care in the Same Day Surgery department.  If you are being discharged the day of surgery, you will not be allowed to drive home. You will need a responsible individual to drive you home and stay with you for 24 hours after surgery.   If you are taking public transportation, you will need to have a responsible individual with you.  Please call the Pre-admissions Testing Dept. at [phone] if you have any questions about these instructions.  Surgery Visitation Policy:  Patients having surgery or a procedure may have two visitors.  Children under the age of 31 must have an adult with them who is not the patient.     Preparing for Surgery with CHLORHEXIDINE GLUCONATE (CHG) Soap  Chlorhexidine Gluconate (CHG) Soap  o An antiseptic cleaner that kills germs and bonds with the skin to continue killing germs even after washing  o Used for showering the night before surgery and morning of surgery  Before surgery, you can play an important role by reducing the number of germs on your skin.  CHG (Chlorhexidine gluconate) soap is an antiseptic cleanser which kills germs and bonds with the skin to continue killing germs even after washing.  Please do not use if you have an allergy to CHG or antibacterial soaps. If your skin becomes reddened/irritated stop using the CHG.  1. Shower the NIGHT BEFORE SURGERY and the MORNING OF SURGERY with CHG soap.  2. If you choose to wash your hair, wash your hair first as usual with your normal shampoo.  3. After shampooing, rinse your hair and body thoroughly to remove the shampoo.  4. Use CHG as you would any other liquid soap. You can apply CHG directly to the skin and wash gently with a scrungie or a clean washcloth.  5. Apply the CHG soap to your body only from the neck down. Do not use on open wounds or open sores. Avoid contact with your eyes, ears, mouth, and genitals (private parts). Wash face and  genitals (private parts) with your normal soap.  6. Wash thoroughly, paying special attention to the area where your surgery will be performed.  7. Thoroughly rinse your body with warm water.  8. Do not shower/wash with your normal soap after using and rinsing off the CHG soap.  9. Pat yourself dry with a clean towel.  10. Wear clean pajamas to bed the night before surgery.  12. Place clean sheets on your bed the night of your first shower and do not sleep with pets.  13. Shower again with the CHG soap on the day of surgery prior to arriving at the hospital.  14. Do not apply any deodorants/lotions/powders.  15. Please wear clean clothes to the hospital.

## 2023-04-22 ENCOUNTER — Other Ambulatory Visit: Payer: Self-pay | Admitting: Internal Medicine

## 2023-04-22 DIAGNOSIS — R911 Solitary pulmonary nodule: Secondary | ICD-10-CM

## 2023-04-23 ENCOUNTER — Other Ambulatory Visit: Payer: Self-pay

## 2023-04-23 ENCOUNTER — Encounter: Payer: Self-pay | Admitting: Surgery

## 2023-04-23 ENCOUNTER — Ambulatory Visit: Payer: Medicare Other | Admitting: Urgent Care

## 2023-04-23 ENCOUNTER — Encounter
Admission: RE | Disposition: A | Discharge status: Home / Self Care | Payer: Self-pay | Source: Home / Self Care | Attending: Surgery

## 2023-04-23 ENCOUNTER — Ambulatory Visit
Admission: RE | Admit: 2023-04-23 | Discharge: 2023-04-23 | Disposition: A | Discharge status: Home / Self Care | Payer: Medicare Other | Attending: Surgery | Admitting: Surgery

## 2023-04-23 DIAGNOSIS — Z7901 Long term (current) use of anticoagulants: Secondary | ICD-10-CM | POA: Diagnosis not present

## 2023-04-23 DIAGNOSIS — I129 Hypertensive chronic kidney disease with stage 1 through stage 4 chronic kidney disease, or unspecified chronic kidney disease: Secondary | ICD-10-CM | POA: Insufficient documentation

## 2023-04-23 DIAGNOSIS — R7303 Prediabetes: Secondary | ICD-10-CM | POA: Diagnosis not present

## 2023-04-23 DIAGNOSIS — K4090 Unilateral inguinal hernia, without obstruction or gangrene, not specified as recurrent: Secondary | ICD-10-CM

## 2023-04-23 DIAGNOSIS — Z87891 Personal history of nicotine dependence: Secondary | ICD-10-CM | POA: Diagnosis not present

## 2023-04-23 DIAGNOSIS — K219 Gastro-esophageal reflux disease without esophagitis: Secondary | ICD-10-CM | POA: Insufficient documentation

## 2023-04-23 DIAGNOSIS — Z8711 Personal history of peptic ulcer disease: Secondary | ICD-10-CM | POA: Diagnosis not present

## 2023-04-23 DIAGNOSIS — E785 Hyperlipidemia, unspecified: Secondary | ICD-10-CM | POA: Insufficient documentation

## 2023-04-23 DIAGNOSIS — K4020 Bilateral inguinal hernia, without obstruction or gangrene, not specified as recurrent: Secondary | ICD-10-CM | POA: Diagnosis present

## 2023-04-23 DIAGNOSIS — Z85038 Personal history of other malignant neoplasm of large intestine: Secondary | ICD-10-CM | POA: Insufficient documentation

## 2023-04-23 DIAGNOSIS — N183 Chronic kidney disease, stage 3 unspecified: Secondary | ICD-10-CM | POA: Insufficient documentation

## 2023-04-23 DIAGNOSIS — I2510 Atherosclerotic heart disease of native coronary artery without angina pectoris: Secondary | ICD-10-CM | POA: Diagnosis not present

## 2023-04-23 DIAGNOSIS — Z86718 Personal history of other venous thrombosis and embolism: Secondary | ICD-10-CM | POA: Insufficient documentation

## 2023-04-23 HISTORY — DX: Palpitations: R00.2

## 2023-04-23 HISTORY — DX: Atherosclerosis of aorta: I70.0

## 2023-04-23 HISTORY — DX: Dyspnea, unspecified: R06.00

## 2023-04-23 HISTORY — DX: Long term (current) use of anticoagulants: Z79.01

## 2023-04-23 HISTORY — DX: Secondary hyperparathyroidism of renal origin: N25.81

## 2023-04-23 HISTORY — DX: Unspecified right bundle-branch block: I45.10

## 2023-04-23 HISTORY — DX: Collapsed vertebra, not elsewhere classified, lumbar region, initial encounter for fracture: M48.56XA

## 2023-04-23 HISTORY — DX: Malignant neoplasm of colon, unspecified: C18.9

## 2023-04-23 HISTORY — DX: Bilateral inguinal hernia, without obstruction or gangrene, not specified as recurrent: K40.20

## 2023-04-23 HISTORY — DX: Cyst of kidney, acquired: N28.1

## 2023-04-23 HISTORY — DX: Sick sinus syndrome: I49.5

## 2023-04-23 SURGERY — HERNIORRHAPHY, INGUINAL, ROBOT-ASSISTED, LAPAROSCOPIC
Anesthesia: General | Site: Inguinal | Laterality: Left

## 2023-04-23 MED ORDER — KETAMINE HCL 50 MG/5ML IJ SOSY
PREFILLED_SYRINGE | INTRAMUSCULAR | Status: DC | PRN
Start: 2023-04-23 — End: 2023-04-23
  Administered 2023-04-23: 20 mg via INTRAVENOUS
  Administered 2023-04-23: 30 mg via INTRAVENOUS

## 2023-04-23 MED ORDER — OXYCODONE HCL 5 MG PO TABS
5.0000 mg | ORAL_TABLET | Freq: Once | ORAL | Status: AC | PRN
  Administered 2023-04-23: 5 mg via ORAL

## 2023-04-23 MED ORDER — DOCUSATE SODIUM 100 MG PO CAPS: 100.0000 mg | ORAL_CAPSULE | Freq: Two times a day (BID) | ORAL | 0 refills | Status: AC | PRN

## 2023-04-23 MED ORDER — ONDANSETRON HCL 4 MG/2ML IJ SOLN
INTRAMUSCULAR | Status: DC | PRN
  Administered 2023-04-23: 4 mg via INTRAVENOUS

## 2023-04-23 MED ORDER — FENTANYL CITRATE (PF) 100 MCG/2ML IJ SOLN: 25.0000 ug | INTRAMUSCULAR | Status: DC | PRN

## 2023-04-23 MED ORDER — OXYCODONE HCL 5 MG/5ML PO SOLN: 5.0000 mg | Freq: Once | ORAL | Status: AC | PRN

## 2023-04-23 MED ORDER — ONDANSETRON HCL 4 MG/2ML IJ SOLN: 4.0000 mg | Freq: Once | INTRAMUSCULAR | Status: DC | PRN

## 2023-04-23 MED ORDER — LIDOCAINE HCL (CARDIAC) PF 100 MG/5ML IV SOSY
PREFILLED_SYRINGE | INTRAVENOUS | Status: DC | PRN
  Administered 2023-04-23: 50 mg via INTRAVENOUS

## 2023-04-23 MED ORDER — CELECOXIB 200 MG PO CAPS
200.0000 mg | ORAL_CAPSULE | ORAL | Status: AC
  Administered 2023-04-23: 200 mg via ORAL

## 2023-04-23 MED ORDER — ROCURONIUM BROMIDE 10 MG/ML (PF) SYRINGE
PREFILLED_SYRINGE | INTRAVENOUS | Status: AC
  Filled 2023-04-23: qty 10

## 2023-04-23 MED ORDER — CELECOXIB 200 MG PO CAPS
ORAL_CAPSULE | ORAL | Status: AC
  Filled 2023-04-23: qty 1

## 2023-04-23 MED ORDER — CEFAZOLIN SODIUM-DEXTROSE 2-4 GM/100ML-% IV SOLN
2.0000 g | INTRAVENOUS | Status: AC
  Administered 2023-04-23: 2 g via INTRAVENOUS

## 2023-04-23 MED ORDER — KETAMINE HCL 50 MG/5ML IJ SOSY
PREFILLED_SYRINGE | INTRAMUSCULAR | Status: AC
  Filled 2023-04-23: qty 5

## 2023-04-23 MED ORDER — EPHEDRINE 5 MG/ML INJ
INTRAVENOUS | Status: AC
  Filled 2023-04-23: qty 5

## 2023-04-23 MED ORDER — ACETAMINOPHEN 500 MG PO TABS
1000.0000 mg | ORAL_TABLET | ORAL | Status: AC
  Administered 2023-04-23: 1000 mg via ORAL

## 2023-04-23 MED ORDER — SUGAMMADEX SODIUM 200 MG/2ML IV SOLN
INTRAVENOUS | Status: DC | PRN
Start: 2023-04-23 — End: 2023-04-23
  Administered 2023-04-23: 200 mg via INTRAVENOUS

## 2023-04-23 MED ORDER — TRAMADOL HCL 50 MG PO TABS: 50.0000 mg | ORAL_TABLET | Freq: Two times a day (BID) | ORAL | 0 refills | Status: AC | PRN

## 2023-04-23 MED ORDER — OXYCODONE HCL 5 MG PO TABS
ORAL_TABLET | ORAL | Status: AC
  Filled 2023-04-23: qty 1

## 2023-04-23 MED ORDER — BUPIVACAINE HCL (PF) 0.5 % IJ SOLN
INTRAMUSCULAR | Status: DC | PRN
Start: 2023-04-23 — End: 2023-04-23
  Administered 2023-04-23: 30 mL

## 2023-04-23 MED ORDER — GABAPENTIN 300 MG PO CAPS
300.0000 mg | ORAL_CAPSULE | ORAL | Status: AC
  Administered 2023-04-23: 300 mg via ORAL

## 2023-04-23 MED ORDER — ROCURONIUM BROMIDE 100 MG/10ML IV SOLN
INTRAVENOUS | Status: DC | PRN
  Administered 2023-04-23: 10 mg via INTRAVENOUS
  Administered 2023-04-23: 50 mg via INTRAVENOUS
  Administered 2023-04-23: 10 mg via INTRAVENOUS

## 2023-04-23 MED ORDER — CEFAZOLIN SODIUM-DEXTROSE 2-4 GM/100ML-% IV SOLN
INTRAVENOUS | Status: AC
  Filled 2023-04-23: qty 100

## 2023-04-23 MED ORDER — DEXAMETHASONE SODIUM PHOSPHATE 10 MG/ML IJ SOLN
INTRAMUSCULAR | Status: AC
  Filled 2023-04-23: qty 1

## 2023-04-23 MED ORDER — ONDANSETRON HCL 4 MG/2ML IJ SOLN
INTRAMUSCULAR | Status: AC
  Filled 2023-04-23: qty 2

## 2023-04-23 MED ORDER — GABAPENTIN 300 MG PO CAPS
ORAL_CAPSULE | ORAL | Status: AC
  Filled 2023-04-23: qty 1

## 2023-04-23 MED ORDER — CHLORHEXIDINE GLUCONATE 0.12 % MT SOLN
15.0000 mL | Freq: Once | OROMUCOSAL | Status: AC
  Administered 2023-04-23: 15 mL via OROMUCOSAL

## 2023-04-23 MED ORDER — BUPIVACAINE LIPOSOME 1.3 % IJ SUSP
INTRAMUSCULAR | Status: AC
  Filled 2023-04-23: qty 20

## 2023-04-23 MED ORDER — FENTANYL CITRATE (PF) 100 MCG/2ML IJ SOLN
INTRAMUSCULAR | Status: DC | PRN
  Administered 2023-04-23 (×3): 25 ug via INTRAVENOUS
  Administered 2023-04-23: 50 ug via INTRAVENOUS
  Administered 2023-04-23: 25 ug via INTRAVENOUS

## 2023-04-23 MED ORDER — BUPIVACAINE LIPOSOME 1.3 % IJ SUSP
INTRAMUSCULAR | Status: DC | PRN
  Administered 2023-04-23: 20 mL

## 2023-04-23 MED ORDER — CHLORHEXIDINE GLUCONATE 0.12 % MT SOLN
OROMUCOSAL | Status: AC
  Filled 2023-04-23: qty 15

## 2023-04-23 MED ORDER — 0.9 % SODIUM CHLORIDE (POUR BTL) OPTIME
TOPICAL | Status: DC | PRN
  Administered 2023-04-23: 500 mL

## 2023-04-23 MED ORDER — FENTANYL CITRATE (PF) 100 MCG/2ML IJ SOLN
INTRAMUSCULAR | Status: AC
  Filled 2023-04-23: qty 2

## 2023-04-23 MED ORDER — SODIUM CHLORIDE 0.9% FLUSH: 10.0000 mL | Freq: Two times a day (BID) | INTRAVENOUS | Status: DC

## 2023-04-23 MED ORDER — BUPIVACAINE HCL (PF) 0.5 % IJ SOLN
INTRAMUSCULAR | Status: AC
  Filled 2023-04-23: qty 30

## 2023-04-23 MED ORDER — ACETAMINOPHEN 500 MG PO TABS
ORAL_TABLET | ORAL | Status: AC
  Filled 2023-04-23: qty 2

## 2023-04-23 MED ORDER — ORAL CARE MOUTH RINSE: 15.0000 mL | Freq: Once | OROMUCOSAL | Status: AC

## 2023-04-23 MED ORDER — CHLORHEXIDINE GLUCONATE CLOTH 2 % EX PADS
6.0000 | MEDICATED_PAD | Freq: Once | CUTANEOUS | Status: AC
  Administered 2023-04-23: 6 via TOPICAL

## 2023-04-23 MED ORDER — PROPOFOL 10 MG/ML IV BOLUS
INTRAVENOUS | Status: DC | PRN
  Administered 2023-04-23: 60 mg via INTRAVENOUS
  Administered 2023-04-23: 20 mg via INTRAVENOUS
  Administered 2023-04-23: 30 mg via INTRAVENOUS

## 2023-04-23 MED ORDER — ACETAMINOPHEN 10 MG/ML IV SOLN: 1000.0000 mg | Freq: Once | INTRAVENOUS | Status: DC | PRN

## 2023-04-23 MED ORDER — DEXAMETHASONE SODIUM PHOSPHATE 10 MG/ML IJ SOLN
INTRAMUSCULAR | Status: DC | PRN
  Administered 2023-04-23: 6 mg via INTRAVENOUS

## 2023-04-23 MED ORDER — LIDOCAINE HCL (PF) 2 % IJ SOLN
INTRAMUSCULAR | Status: AC
  Filled 2023-04-23: qty 5

## 2023-04-23 MED ORDER — EPHEDRINE SULFATE-NACL 50-0.9 MG/10ML-% IV SOSY
PREFILLED_SYRINGE | INTRAVENOUS | Status: DC | PRN
  Administered 2023-04-23: 10 mg via INTRAVENOUS
  Administered 2023-04-23: 5 mg via INTRAVENOUS
  Administered 2023-04-23: 10 mg via INTRAVENOUS

## 2023-04-23 MED ORDER — LACTATED RINGERS IV SOLN: INTRAVENOUS | Status: DC

## 2023-04-23 SURGICAL SUPPLY — 50 items
ADH SKN CLS APL DERMABOND .7 (GAUZE/BANDAGES/DRESSINGS) ×1
BAG PRESSURE INF REUSE 1000 (BAG) IMPLANT
BLADE SURG SZ11 CARB STEEL (BLADE) ×2 IMPLANT
BNDG GAUZE DERMACEA FLUFF 4 (GAUZE/BANDAGES/DRESSINGS) ×2 IMPLANT
BNDG GZE DERMACEA 4 6PLY (GAUZE/BANDAGES/DRESSINGS) ×1
COVER TIP SHEARS 8 DVNC (MISCELLANEOUS) ×2 IMPLANT
COVER WAND RF STERILE (DRAPES) ×2 IMPLANT
DERMABOND ADVANCED .7 DNX12 (GAUZE/BANDAGES/DRESSINGS) ×2 IMPLANT
DRAPE ARM DVNC X/XI (DISPOSABLE) ×6 IMPLANT
DRAPE COLUMN DVNC XI (DISPOSABLE) ×2 IMPLANT
ELECT CAUTERY BLADE 6.4 (BLADE) IMPLANT
ELECT REM PT RETURN 9FT ADLT (ELECTROSURGICAL) ×1
ELECTRODE REM PT RTRN 9FT ADLT (ELECTROSURGICAL) ×2 IMPLANT
FORCEPS BPLR FENES DVNC XI (FORCEP) ×2 IMPLANT
GLOVE BIOGEL PI IND STRL 7.0 (GLOVE) ×4 IMPLANT
GLOVE SURG SYN 6.5 ES PF (GLOVE) ×4 IMPLANT
GLOVE SURG SYN 6.5 PF PI (GLOVE) ×4 IMPLANT
GOWN STRL REUS W/ TWL LRG LVL3 (GOWN DISPOSABLE) ×6 IMPLANT
GOWN STRL REUS W/TWL LRG LVL3 (GOWN DISPOSABLE) ×4
IRRIGATOR SUCT 8 DISP DVNC XI (IRRIGATION / IRRIGATOR) IMPLANT
IV NS 1000ML (IV SOLUTION)
IV NS 1000ML BAXH (IV SOLUTION) IMPLANT
LABEL OR SOLS (LABEL) IMPLANT
MANIFOLD NEPTUNE II (INSTRUMENTS) ×2 IMPLANT
MESH 3DMAX MID 4X6 LT LRG (Mesh General) IMPLANT
NDL DRIVE SUT CUT DVNC (INSTRUMENTS) ×2 IMPLANT
NDL HYPO 22X1.5 SAFETY MO (MISCELLANEOUS) ×2 IMPLANT
NDL INSUFFLATION 14GA 120MM (NEEDLE) ×2 IMPLANT
NEEDLE DRIVE SUT CUT DVNC (INSTRUMENTS) ×1 IMPLANT
NEEDLE HYPO 22X1.5 SAFETY MO (MISCELLANEOUS) ×1 IMPLANT
NEEDLE INSUFFLATION 14GA 120MM (NEEDLE) ×1 IMPLANT
OBTURATOR OPTICAL STND 8 DVNC (TROCAR) ×1
OBTURATOR OPTICALSTD 8 DVNC (TROCAR) ×2 IMPLANT
PACK LAP CHOLECYSTECTOMY (MISCELLANEOUS) ×2 IMPLANT
PENCIL SMOKE EVACUATOR (MISCELLANEOUS) ×2 IMPLANT
SCISSORS MNPLR CVD DVNC XI (INSTRUMENTS) ×2 IMPLANT
SEAL UNIV 5-12 XI (MISCELLANEOUS) ×6 IMPLANT
SET TUBE SMOKE EVAC HIGH FLOW (TUBING) ×2 IMPLANT
SOL ELECTROSURG ANTI STICK (MISCELLANEOUS) ×1
SOLUTION ELECTROSURG ANTI STCK (MISCELLANEOUS) ×2 IMPLANT
SUT MNCRL 4-0 (SUTURE) ×1
SUT MNCRL 4-0 27XMFL (SUTURE) ×1
SUT VIC AB 2-0 SH 27 (SUTURE) ×2
SUT VIC AB 2-0 SH 27XBRD (SUTURE) ×2 IMPLANT
SUT VLOC 90 6 CV-15 VIOLET (SUTURE) ×2 IMPLANT
SUTURE MNCRL 4-0 27XMF (SUTURE) ×2 IMPLANT
SYR 30ML LL (SYRINGE) ×2 IMPLANT
TAPE TRANSPORE STRL 2 31045 (GAUZE/BANDAGES/DRESSINGS) ×2 IMPLANT
TRAP FLUID SMOKE EVACUATOR (MISCELLANEOUS) ×2 IMPLANT
WATER STERILE IRR 500ML POUR (IV SOLUTION) ×2 IMPLANT

## 2023-04-23 NOTE — Anesthesia Postprocedure Evaluation (Signed)
Anesthesia Post Note  Patient: Juan Arnold  Procedure(s) Performed: XI ROBOTIC ASSISTED INGUINAL HERNIA w/ mesh (Left: Inguinal)  Patient location during evaluation: PACU Anesthesia Type: General Level of consciousness: awake and alert Pain management: pain level controlled Vital Signs Assessment: post-procedure vital signs reviewed and stable Respiratory status: spontaneous breathing, nonlabored ventilation, respiratory function stable and patient connected to nasal cannula oxygen Cardiovascular status: blood pressure returned to baseline and stable Postop Assessment: no apparent nausea or vomiting Anesthetic complications: no  No notable events documented.   Last Vitals:  Vitals:   04/23/23 1200 04/23/23 1227  BP: (!) 151/53 (!) 153/65  Pulse: 64 (!) 49  Resp: 17 16  Temp: (!) 36.2 C (!) 36.1 C  SpO2: 98% 100%    Last Pain:  Vitals:   04/23/23 1227  TempSrc: Temporal  PainSc: 5                  Stephanie Coup

## 2023-04-23 NOTE — Discharge Instructions (Addendum)
Hernia repair, Care After This sheet gives you information about how to care for yourself after your procedure. Your health care provider may also give you more specific instructions. If you have problems or questions, contact your health care provider. What can I expect after the procedure? After your procedure, it is common to have the following: Pain in your abdomen, especially in the incision areas. You will be given medicine to control the pain. Tiredness. This is a normal part of the recovery process. Your energy level will return to normal over the next several weeks. Changes in your bowel movements, such as constipation or needing to go more often. Talk with your health care provider about how to manage this. Follow these instructions at home: Medicines  tylenol  as needed for discomfort.    Use narcotics, if prescribed, only when tylenol  is not enough to control pain.  325-650mg  every 8hrs to max of 3000mg /24hrs (including the 325mg  in every norco dose) for the tylenol.   Resume Eliquis in 48 hours  PLEASE RECORD NUMBER OF PILLS TAKEN UNTIL NEXT FOLLOW UP APPT.  THIS WILL HELP DETERMINE HOW READY YOU ARE TO BE RELEASED FROM ANY ACTIVITY RESTRICTIONS Do not drive or use heavy machinery while taking prescription pain medicine. Do not drink alcohol while taking prescription pain medicine.  Incision care    Follow instructions from your health care provider about how to take care of your incision areas. Make sure you: Keep your incisions clean and dry. Wash your hands with soap and water before and after applying medicine to the areas, and before and after changing your bandage (dressing). If soap and water are not available, use hand sanitizer. Change your dressing as told by your health care provider. Leave stitches (sutures), skin glue, or adhesive strips in place. These skin closures may need to stay in place for 2 weeks or longer. If adhesive strip edges start to loosen and curl  up, you may trim the loose edges. Do not remove adhesive strips completely unless your health care provider tells you to do that. Do not wear tight clothing over the incisions. Tight clothing may rub and irritate the incision areas, which may cause the incisions to open. Do not take baths, swim, or use a hot tub until your health care provider approves. OK TO SHOWER IN 24HRS.   Check your incision area every day for signs of infection. Check for: More redness, swelling, or pain. More fluid or blood. Warmth. Pus or a bad smell. Activity Avoid lifting anything that is heavier than 10 lb (4.5 kg) for 2 weeks or until your health care provider says it is okay. No pushing/pulling greater than 30lbs You may resume normal activities as told by your health care provider. Ask your health care provider what activities are safe for you. Take rest breaks during the day as needed. Eating and drinking Follow instructions from your health care provider about what you can eat after surgery. To prevent or treat constipation while you are taking prescription pain medicine, your health care provider may recommend that you: Drink enough fluid to keep your urine clear or pale yellow. Take over-the-counter or prescription medicines. Eat foods that are high in fiber, such as fresh fruits and vegetables, whole grains, and beans. Limit foods that are high in fat and processed sugars, such as fried and sweet foods. General instructions Ask your health care provider when you will need an appointment to get your sutures or staples removed. Keep all  follow-up visits as told by your health care provider. This is important. Contact a health care provider if: You have more redness, swelling, or pain around your incisions. You have more fluid or blood coming from the incisions. Your incisions feel warm to the touch. You have pus or a bad smell coming from your incisions or your dressing. You have a fever. You have an  incision that breaks open (edges not staying together) after sutures or staples have been removed. You develop a rash. You have chest pain or difficulty breathing. You have pain or swelling in your legs. You feel light-headed or you faint. Your abdomen swells (becomes distended). You have nausea or vomiting. You have blood in your stool (feces). This information is not intended to replace advice given to you by your health care provider. Make sure you discuss any questions you have with your health care provider. Document Released: 01/10/2005 Document Revised: 03/12/2018 Document Reviewed: 03/24/2016 Elsevier Interactive Patient Education  2019 [employer]   AMBULATORY SURGERY  DISCHARGE INSTRUCTIONS   The drugs that you were given will stay in your system until tomorrow so for the next 24 hours you should not:  Drive an automobile Make any legal decisions Drink any alcoholic beverage   You may resume regular meals tomorrow.  Today it is better to start with liquids and gradually work up to solid foods.  You may eat anything you prefer, but it is better to start with liquids, then soup and crackers, and gradually work up to solid foods.   Please notify your doctor immediately if you have any unusual bleeding, trouble breathing, redness and pain at the surgery site, drainage, fever, or pain not relieved by medication.  Your post-operative visit with Dr.                                     is: Date:                        Time:    Please call to schedule your post-operative visit.  Additional Instructions:

## 2023-04-23 NOTE — Transfer of Care (Signed)
Immediate Anesthesia Transfer of Care Note  Patient: Juan Arnold  Procedure(s) Performed: XI ROBOTIC ASSISTED INGUINAL HERNIA w/ mesh (Left: Inguinal)  Patient Location: PACU  Anesthesia Type:General  Level of Consciousness: awake, oriented, and patient cooperative  Airway & Oxygen Therapy: Patient Spontanous Breathing  Post-op Assessment: Report given to RN and Post -op Vital signs reviewed and stable  Post vital signs: Reviewed and stable  Last Vitals:  Vitals Value Taken Time  BP 174/75 04/23/23 1130  Temp    Pulse 54 04/23/23 1133  Resp 14 04/23/23 1133  SpO2 100 % 04/23/23 1133  Vitals shown include unfiled device data.  Last Pain:  Vitals:   04/23/23 0845  TempSrc: Temporal  PainSc: 0-No pain         Complications: No notable events documented.

## 2023-04-23 NOTE — Anesthesia Preprocedure Evaluation (Addendum)
Anesthesia Evaluation  Patient identified by MRN, date of birth, ID band Patient awake    Reviewed: Allergy & Precautions, NPO status , Patient's Chart, lab work & pertinent test results  History of Anesthesia Complications Negative for: history of anesthetic complications  Airway Mallampati: IV   Neck ROM: Full    Dental  (+) Edentulous Upper, Edentulous Lower   Pulmonary former smoker (quit 1995)   Pulmonary exam normal breath sounds clear to auscultation       Cardiovascular hypertension, + CAD  + Valvular Problems/Murmurs (severe MR and TR)  Rhythm:Regular Rate:Normal + Systolic murmurs PVCs, PACs; hx DVT on Eliquis  ECG 12/16/22:  Wide QRS rhythm with occasional premature ventricular complexes  Right bundle branch block  Septal infarct , age undetermined   Echo 05/06/21:  NORMAL LEFT VENTRICULAR SYSTOLIC FUNCTION   WITH MILD LVH  NORMAL RIGHT VENTRICULAR SYSTOLIC FUNCTION  NO VALVULAR STENOSIS  MODERATE to SEVERE TR  SEVERE MR  TRIVIAL AR  MILD PR  EF 55%    Neuro/Psych HOH    GI/Hepatic PUD,GERD  ,,Colon CA   Endo/Other  Prediabetes   Renal/GU Renal disease (stage III CKD)     Musculoskeletal   Abdominal   Peds  Hematology negative hematology ROS (+)   Anesthesia Other Findings Reviewed and agree with Edd Fabian pre-anesthesia clinical review note.    Cardiology note 12/10/22:  88 y.o. male with  1. History of DVT (deep vein thrombosis)  2. Premature ventricular contractions  3. Severe mitral regurgitation  4. S/P cardiac cath  5. Sinoatrial node dysfunction (CMS/HHS-HCC)  6. Primary hypertension  7. Hyperlipidemia, unspecified hyperlipidemia type   88 year old gentleman with essential hypertension with mildly elevated blood pressure today on metoprolol succinate and olmesartan. He has a history of asymptomatic bradycardia, and experienced palpitations when metoprolol succinate dose was  reduced to 12.5 mg, which resolved when the dose was increased back to 25 mg. He has frequent premature atrial and ventricular contractions. 2D echocardiogram 05/06/2021 revealed severe mitral regurgitation. Cardiac catheterization 07/23/2021 revealed insignificant coronary artery disease, and only trivial mitral regurgitation. The patient has essential hypertension, blood pressure well-controlled on current BP medications.  Plan   1 . Continue current medications 2 . Counseled patient about low-sodium diet 3. DASH diet printed instructions given to the patient 4. Counseled patient about low-cholesterol diet 5. Continue Crestor for hyperlipidemia management  6. Heart healthy diet printed instructions given to the patient 7. Defer referral for mitral valve repair 8. Return to clinic for follow-up in 6 months  No orders of the defined types were placed in this encounter.  Return in about 6 months (around 06/11/2023).    Reproductive/Obstetrics                             Anesthesia Physical Anesthesia Plan  ASA: 3  Anesthesia Plan: General   Post-op Pain Management:    Induction: Intravenous  PONV Risk Score and Plan: 2 and Ondansetron, Dexamethasone and Treatment may vary due to age or medical condition  Airway Management Planned: Oral ETT  Additional Equipment:   Intra-op Plan:   Post-operative Plan: Extubation in OR  Informed Consent: I have reviewed the patients History and Physical, chart, labs and discussed the procedure including the risks, benefits and alternatives for the proposed anesthesia with the patient or authorized representative who has indicated his/her understanding and acceptance.     Dental advisory given  Plan Discussed with: CRNA  Anesthesia Plan Comments: (Patient consented for risks of anesthesia including but not limited to:  - adverse reactions to medications - damage to eyes, teeth, lips or other oral mucosa - nerve  damage due to positioning  - sore throat or hoarseness - damage to heart, brain, nerves, lungs, other parts of body or loss of life  Informed patient about role of CRNA in peri- and intra-operative care.  Patient voiced understanding.)        Anesthesia Quick Evaluation

## 2023-04-23 NOTE — Anesthesia Procedure Notes (Signed)
Procedure Name: Intubation Date/Time: 04/23/2023 9:48 AM  Performed by: Jeannene Patella, CRNAPre-anesthesia Checklist: Patient identified, Emergency Drugs available, Suction available, Patient being monitored and Timeout performed Patient Re-evaluated:Patient Re-evaluated prior to induction Oxygen Delivery Method: Circle system utilized Preoxygenation: Pre-oxygenation with 100% oxygen Induction Type: IV induction Ventilation: Mask ventilation without difficulty and Oral airway inserted - appropriate to patient size Laryngoscope Size: McGraph and 4 Grade View: Grade II Tube type: Oral Tube size: 7.5 mm Number of attempts: 1 Airway Equipment and Method: Stylet and Video-laryngoscopy Placement Confirmation: ETT inserted through vocal cords under direct vision, positive ETCO2 and breath sounds checked- equal and bilateral Secured at: 21 (@ LIP) cm Dental Injury: Teeth and Oropharynx as per pre-operative assessment

## 2023-04-23 NOTE — Op Note (Addendum)
Preoperative diagnosis: Left, initial reducible inguinal Hernia.  Postoperative diagnosis: Left inguinal Hernia  Procedure: Robotic assisted laparoscopic left inguinal hernia repair with mesh  Anesthesia: General  Surgeon: Dr. Tonna Boehringer  Wound Classification: Clean  Specimen: none  Complications: None  Estimated Blood Loss: 10mL   Indications:  inguinal hernia. Repair was indicated to avoid complications of incarceration, obstruction and pain, and a prosthetic mesh repair was elected.  See H&P for further details.  Findings: 1. Vas Deferens and cord structures identified and preserved 2. Bard 3D max medium weight mesh used for repair 3. Adequate hemostasis achieved  Description of procedure: The patient was taken to the operating room. A time-out was completed verifying correct patient, procedure, site, positioning, and implant(s) and/or special equipment prior to beginning this procedure.  Area was prepped and draped in the usual sterile fashion. An incision was marked 20 cm above the pubic tubercle, slightly above the umbilicus  Scrotum wrapped in Kerlix roll.  Veress needle inserted at palmer's point.  Saline drop test noted to be positive with gradual increase in pressure after initiation of gas insufflation.  15 mm of pressure was achieved prior to removing the Veress needle and then placing a 8 mm port via the Optiview technique through the supraumbilical site.  Inspection of the area afterwards noted no injury to the surrounding organs during insertion of the needle and the port.  2 port sites were marked 8 cm to the lateral sides of the initial port, and a 8 mm robotic port was placed on the left side, another 8 mm robotic port on the right side under direct supervision.  Local anesthesia  infused to the preplanned incision sites prior to insertion of the port.  The [employer] platform was then brought into the operative field and docked to the ports.  Examination of the abdominal  cavity noted a left inguinal hernia.  Right inguinal area was attenuated but no obvious defect was noted.  A peritoneal flap was created approximately 8cm cephalad to the defect by using scissors with electrocautery.  Dissection was carried down towards the pubic tubercle, developing the myopectineal orifice view.  Laterally the flap was carried towards the ASIS.  Large hernia sac was noted, which carefully dissected away from the adjacent tissues to be fully reduced out of hernia cavity.  Any bleeding was controlled with combination of electrocautery and manual pressure.    After confirming adequate dissection and the peritoneal reflection completely down and away from the cord structures, a Large Bard 3DMax medium weight mesh was placed within the anterior abdominal wall, secured in place using 2-0 Vicryl on an SH needle immediately above the pubic tubercle.  After noting proper placement of the mesh with the peritoneal reflection deep to it, the previously created peritoneal flap was secured back up to the anterior abdominal wall using running 3-0 V-Lock.  Large hernia sac was incorporated into the flap closure to further prevent recurrence.  Both needles were then removed out of the abdominal cavity, Xi platform undocked from the ports and removed off of operative field.  exparel infused as ilioinguinal block.  Abdomen then desufflated and ports removed. All the skin incisions were then closed with a subcuticular stitch of Monocryl 4-0. Dermabond was applied. The testis was gently pulled down into its anatomic position in the scrotum.  The patient tolerated the procedure well and was taken to the postanesthesia care unit in stable condition. Sponge and instrument count correct at end of procedure.

## 2023-04-23 NOTE — Progress Notes (Signed)
Patient awake/alert x4, tolerated po fluids and crackers without event.

## 2023-04-23 NOTE — Interval H&P Note (Signed)
History and Physical Interval Note:  04/23/2023 9:14 AM  Juan Arnold  has presented today for surgery, with the diagnosis of non-recurrent bilateral inguinal hernia w/o obstruction or gangrene K40.20.  The various methods of treatment have been discussed with the patient and family. After consideration of risks, benefits and other options for treatment, the patient has consented to  Procedure(s): XI ROBOTIC ASSISTED INGUINAL HERNIA w/ mesh (Bilateral) as a surgical intervention.  The patient's history has been reviewed, patient examined, no change in status, stable for surgery.  I have reviewed the patient's chart and labs.  Questions were answered to the patient's satisfaction.     Zelda Reames Tonna Boehringer

## 2023-04-23 NOTE — H&P (Signed)
Subjective:   CC: Non-recurrent bilateral inguinal hernia without obstruction or gangrene [K40.20]  HPI:  Juan Arnold is a 88 y.o. male who was referred by Joesphine Bare* for evaluation of above. Symptoms were first noted a few months ago. Pain is dull, intermittent, and bilateral groin.  Associated with lump, exacerbated by exertion.  Lump is reducible.    Past Medical History:  has a past medical history of Cancer (CMS/HHS-HCC), Heart murmur, Hyperlipidemia, Hypertension, Peptic ulcer disease, and Sinus bradycardia.  Past Surgical History:  Past Surgical History:  Procedure Laterality Date   APPENDECTOMY  1954   REPAIR INGUINAL HERNIA  1955   spinal cyst excision  1956   CARDIAC CATHETERIZATION  05/14/2006   insignificant CAD   COLECTOMY PARTIAL ABDOMINAL & TRANSANAL      Family History: family history includes Cancer in an other family member; Kidney disease in an other family member.  Social History:  reports that he has quit smoking. His smoking use included cigarettes. He has quit using smokeless tobacco. He reports that he does not drink alcohol and does not use drugs.  Current Medications: has a current medication list which includes the following prescription(s): apixaban, aspirin, cholestyramine, metoprolol succinate, rosuvastatin, and tramadol.  Allergies:  Allergies as of 03/17/2023 - Reviewed 03/17/2023  Allergen Reaction Noted   Ointment,white (obsolete) Unknown 07/23/2021    ROS:  A 15 point review of systems was performed and pertinent positives and negatives noted in HPI   Objective:     BP 134/64   Pulse (!) 40   Ht 182 cm (5' 11.65")   Wt 82.9 kg (182 lb 12.2 oz)   BMI 25.03 kg/m   Constitutional :  Alert, cooperative, no distress  Lymphatics/Throat:  Supple, no lymphadenopathy  Respiratory:  clear to auscultation bilaterally  Cardiovascular:  regular rate and rhythm  Gastrointestinal: soft, non-tender; bowel sounds normal; no masses,   no organomegaly. inguinal hernia noted.  moderate, reducible, no overlying skin changes, and possibly bilateral, for sure left.  Musculoskeletal: Steady gait and movement  Skin: Cool and moist, low midline surgical scars  Psychiatric: Normal affect, non-agitated, not confused       LABS:  N/a   RADS: N/a Assessment:       Non-recurrent bilateral inguinal hernia without obstruction or gangrene [K40.20], left, likely right as well. Hx of repair on right side.  Hx of LAR.  Plan:     1. Non-recurrent bilateral inguinal hernia without obstruction or gangrene [K40.20]   Discussed the risk of surgery including recurrence, which can be up to 50% in the case of incisional or complex hernias, possible use of prosthetic materials (mesh) and the increased risk of mesh infxn if used, bleeding, chronic pain, post-op infxn, post-op SBO or ileus, and possible re-operation to address said risks. The risks of general anesthetic, if used, includes MI, CVA, sudden death or even reaction to anesthetic medications also discussed. Alternatives include continued observation.  Benefits include possible symptom relief, prevention of incarceration, strangulation, enlargement in size over time, and the risk of emergency surgery in the face of strangulation.   Typical post-op recovery time of 3-5 days with 2 weeks of activity restrictions were also discussed.  ED return precautions given for sudden increase in pain, size of hernia with accompanying fever, nausea, and/or vomiting.  The patient verbalized understanding and all questions were answered to the patient's satisfaction.   2. Patient has elected to proceed with surgical treatment. Procedure will be scheduled. bilateral, robotic  assisted laparoscopic.   Hold eliquis 3 days prior if safe to do so.  Medical risk stratification with cardiology and primary  labs/images/medications/previous chart entries reviewed personally and relevant changes/updates noted  above.

## 2023-04-26 NOTE — Plan of Care (Signed)
CHL Tonsillectomy/Adenoidectomy, Postoperative PEDS care plan entered in error.

## 2023-05-14 ENCOUNTER — Other Ambulatory Visit: Payer: Self-pay | Admitting: Internal Medicine

## 2023-05-14 DIAGNOSIS — R7989 Other specified abnormal findings of blood chemistry: Secondary | ICD-10-CM

## 2023-05-14 DIAGNOSIS — R000 Tachycardia, unspecified: Secondary | ICD-10-CM

## 2023-05-18 ENCOUNTER — Ambulatory Visit
Admission: RE | Admit: 2023-05-18 | Discharge: 2023-05-18 | Disposition: A | Discharge status: Home / Self Care | Payer: Medicare Other | Source: Ambulatory Visit | Attending: Internal Medicine | Admitting: Internal Medicine

## 2023-05-18 DIAGNOSIS — R000 Tachycardia, unspecified: Secondary | ICD-10-CM | POA: Diagnosis present

## 2023-05-18 DIAGNOSIS — R7989 Other specified abnormal findings of blood chemistry: Secondary | ICD-10-CM | POA: Diagnosis present

## 2023-05-18 LAB — POCT I-STAT CREATININE: Creatinine, Ser: 1.5 mg/dL — ABNORMAL HIGH (ref 0.61–1.24)

## 2023-05-18 MED ORDER — IOHEXOL 350 MG/ML SOLN
60.0000 mL | Freq: Once | INTRAVENOUS | Status: AC | PRN
  Administered 2023-05-18: 60 mL via INTRAVENOUS

## 2023-08-09 ENCOUNTER — Encounter: Payer: Self-pay | Admitting: *Deleted

## 2023-08-09 ENCOUNTER — Inpatient Hospital Stay (HOSPITAL_COMMUNITY)
Admission: EM | Admit: 2023-08-09 | Discharge: 2023-08-17 | DRG: 308 | Disposition: A | Discharge status: Home / Self Care | Payer: Medicare Other | Attending: Family Medicine | Admitting: Family Medicine

## 2023-08-09 ENCOUNTER — Other Ambulatory Visit: Payer: Self-pay

## 2023-08-09 ENCOUNTER — Encounter (HOSPITAL_COMMUNITY): Payer: Self-pay | Admitting: Emergency Medicine

## 2023-08-09 ENCOUNTER — Emergency Department (HOSPITAL_COMMUNITY): Payer: Medicare Other

## 2023-08-09 ENCOUNTER — Ambulatory Visit
Admission: EM | Admit: 2023-08-09 | Discharge: 2023-08-09 | Disposition: A | Discharge status: Home / Self Care | Payer: Medicare Other | Attending: Nurse Practitioner | Admitting: Nurse Practitioner

## 2023-08-09 DIAGNOSIS — E876 Hypokalemia: Secondary | ICD-10-CM | POA: Diagnosis not present

## 2023-08-09 DIAGNOSIS — I130 Hypertensive heart and chronic kidney disease with heart failure and stage 1 through stage 4 chronic kidney disease, or unspecified chronic kidney disease: Secondary | ICD-10-CM | POA: Diagnosis present

## 2023-08-09 DIAGNOSIS — E785 Hyperlipidemia, unspecified: Secondary | ICD-10-CM | POA: Diagnosis present

## 2023-08-09 DIAGNOSIS — Z85038 Personal history of other malignant neoplasm of large intestine: Secondary | ICD-10-CM

## 2023-08-09 DIAGNOSIS — N2581 Secondary hyperparathyroidism of renal origin: Secondary | ICD-10-CM | POA: Diagnosis present

## 2023-08-09 DIAGNOSIS — I480 Paroxysmal atrial fibrillation: Secondary | ICD-10-CM | POA: Diagnosis present

## 2023-08-09 DIAGNOSIS — Z86718 Personal history of other venous thrombosis and embolism: Secondary | ICD-10-CM

## 2023-08-09 DIAGNOSIS — J189 Pneumonia, unspecified organism: Secondary | ICD-10-CM | POA: Diagnosis not present

## 2023-08-09 DIAGNOSIS — Z7901 Long term (current) use of anticoagulants: Secondary | ICD-10-CM | POA: Diagnosis not present

## 2023-08-09 DIAGNOSIS — R062 Wheezing: Secondary | ICD-10-CM

## 2023-08-09 DIAGNOSIS — Z8672 Personal history of thrombophlebitis: Secondary | ICD-10-CM

## 2023-08-09 DIAGNOSIS — R7303 Prediabetes: Secondary | ICD-10-CM | POA: Diagnosis present

## 2023-08-09 DIAGNOSIS — I2489 Other forms of acute ischemic heart disease: Secondary | ICD-10-CM | POA: Diagnosis present

## 2023-08-09 DIAGNOSIS — I825Y2 Chronic embolism and thrombosis of unspecified deep veins of left proximal lower extremity: Secondary | ICD-10-CM

## 2023-08-09 DIAGNOSIS — Z974 Presence of external hearing-aid: Secondary | ICD-10-CM

## 2023-08-09 DIAGNOSIS — I429 Cardiomyopathy, unspecified: Secondary | ICD-10-CM | POA: Diagnosis not present

## 2023-08-09 DIAGNOSIS — I4510 Unspecified right bundle-branch block: Secondary | ICD-10-CM | POA: Diagnosis present

## 2023-08-09 DIAGNOSIS — I5023 Acute on chronic systolic (congestive) heart failure: Secondary | ICD-10-CM | POA: Diagnosis not present

## 2023-08-09 DIAGNOSIS — I361 Nonrheumatic tricuspid (valve) insufficiency: Secondary | ICD-10-CM | POA: Diagnosis not present

## 2023-08-09 DIAGNOSIS — R0602 Shortness of breath: Secondary | ICD-10-CM

## 2023-08-09 DIAGNOSIS — I4891 Unspecified atrial fibrillation: Principal | ICD-10-CM

## 2023-08-09 DIAGNOSIS — I495 Sick sinus syndrome: Secondary | ICD-10-CM | POA: Diagnosis present

## 2023-08-09 DIAGNOSIS — K567 Ileus, unspecified: Secondary | ICD-10-CM | POA: Diagnosis not present

## 2023-08-09 DIAGNOSIS — Z7902 Long term (current) use of antithrombotics/antiplatelets: Secondary | ICD-10-CM

## 2023-08-09 DIAGNOSIS — N183 Chronic kidney disease, stage 3 unspecified: Secondary | ICD-10-CM | POA: Diagnosis present

## 2023-08-09 DIAGNOSIS — J9601 Acute respiratory failure with hypoxia: Secondary | ICD-10-CM | POA: Diagnosis not present

## 2023-08-09 DIAGNOSIS — I700 Atherosclerosis of aorta: Secondary | ICD-10-CM | POA: Diagnosis present

## 2023-08-09 DIAGNOSIS — Z1152 Encounter for screening for COVID-19: Secondary | ICD-10-CM | POA: Diagnosis not present

## 2023-08-09 DIAGNOSIS — I272 Pulmonary hypertension, unspecified: Secondary | ICD-10-CM | POA: Diagnosis present

## 2023-08-09 DIAGNOSIS — I5020 Unspecified systolic (congestive) heart failure: Secondary | ICD-10-CM | POA: Diagnosis not present

## 2023-08-09 DIAGNOSIS — I428 Other cardiomyopathies: Secondary | ICD-10-CM | POA: Diagnosis present

## 2023-08-09 DIAGNOSIS — I2510 Atherosclerotic heart disease of native coronary artery without angina pectoris: Secondary | ICD-10-CM | POA: Diagnosis present

## 2023-08-09 DIAGNOSIS — Z9841 Cataract extraction status, right eye: Secondary | ICD-10-CM

## 2023-08-09 DIAGNOSIS — Z79899 Other long term (current) drug therapy: Secondary | ICD-10-CM | POA: Diagnosis not present

## 2023-08-09 DIAGNOSIS — Z87891 Personal history of nicotine dependence: Secondary | ICD-10-CM | POA: Diagnosis not present

## 2023-08-09 DIAGNOSIS — I340 Nonrheumatic mitral (valve) insufficiency: Secondary | ICD-10-CM | POA: Diagnosis not present

## 2023-08-09 DIAGNOSIS — N179 Acute kidney failure, unspecified: Secondary | ICD-10-CM | POA: Diagnosis not present

## 2023-08-09 DIAGNOSIS — N1832 Chronic kidney disease, stage 3b: Secondary | ICD-10-CM | POA: Diagnosis present

## 2023-08-09 DIAGNOSIS — Z888 Allergy status to other drugs, medicaments and biological substances status: Secondary | ICD-10-CM

## 2023-08-09 DIAGNOSIS — K219 Gastro-esophageal reflux disease without esophagitis: Secondary | ICD-10-CM | POA: Diagnosis present

## 2023-08-09 DIAGNOSIS — I82462 Acute embolism and thrombosis of left calf muscular vein: Secondary | ICD-10-CM | POA: Diagnosis not present

## 2023-08-09 DIAGNOSIS — I82409 Acute embolism and thrombosis of unspecified deep veins of unspecified lower extremity: Secondary | ICD-10-CM | POA: Diagnosis present

## 2023-08-09 DIAGNOSIS — K589 Irritable bowel syndrome without diarrhea: Secondary | ICD-10-CM | POA: Diagnosis present

## 2023-08-09 DIAGNOSIS — I10 Essential (primary) hypertension: Secondary | ICD-10-CM

## 2023-08-09 DIAGNOSIS — J439 Emphysema, unspecified: Secondary | ICD-10-CM | POA: Diagnosis present

## 2023-08-09 DIAGNOSIS — C189 Malignant neoplasm of colon, unspecified: Secondary | ICD-10-CM | POA: Diagnosis present

## 2023-08-09 DIAGNOSIS — Z8711 Personal history of peptic ulcer disease: Secondary | ICD-10-CM

## 2023-08-09 DIAGNOSIS — Z9842 Cataract extraction status, left eye: Secondary | ICD-10-CM

## 2023-08-09 LAB — BASIC METABOLIC PANEL
Anion gap: 9 (ref 5–15)
BUN: 34 mg/dL — ABNORMAL HIGH (ref 8–23)
CO2: 22 mmol/L (ref 22–32)
Calcium: 8.7 mg/dL — ABNORMAL LOW (ref 8.9–10.3)
Chloride: 111 mmol/L (ref 98–111)
Creatinine, Ser: 1.63 mg/dL — ABNORMAL HIGH (ref 0.61–1.24)
GFR, Estimated: 41 mL/min — ABNORMAL LOW (ref >60)
Glucose, Bld: 138 mg/dL — ABNORMAL HIGH (ref 70–99)
Potassium: 4.5 mmol/L (ref 3.5–5.1)
Sodium: 142 mmol/L (ref 135–145)

## 2023-08-09 LAB — CBC
HCT: 38.9 % — ABNORMAL LOW (ref 39.0–52.0)
Hemoglobin: 12.1 g/dL — ABNORMAL LOW (ref 13.0–17.0)
MCH: 32 pg (ref 26.0–34.0)
MCHC: 31.1 g/dL (ref 30.0–36.0)
MCV: 102.9 fL — ABNORMAL HIGH (ref 80.0–100.0)
Platelets: 206 10*3/uL (ref 150–400)
RBC: 3.78 MIL/uL — ABNORMAL LOW (ref 4.22–5.81)
RDW: 14.3 % (ref 11.5–15.5)
WBC: 8.6 10*3/uL (ref 4.0–10.5)
nRBC: 0 % (ref 0.0–0.2)

## 2023-08-09 LAB — BRAIN NATRIURETIC PEPTIDE: B Natriuretic Peptide: 1193 pg/mL — ABNORMAL HIGH (ref 0.0–100.0)

## 2023-08-09 LAB — RESP PANEL BY RT-PCR (RSV, FLU A&B, COVID)  RVPGX2
Influenza A by PCR: NEGATIVE
Influenza B by PCR: NEGATIVE
Resp Syncytial Virus by PCR: NEGATIVE
SARS Coronavirus 2 by RT PCR: NEGATIVE

## 2023-08-09 LAB — TROPONIN I (HIGH SENSITIVITY)
Troponin I (High Sensitivity): 25 ng/L — ABNORMAL HIGH (ref <18)
Troponin I (High Sensitivity): 23 ng/L — ABNORMAL HIGH (ref <18)

## 2023-08-09 LAB — LACTIC ACID, PLASMA
Lactic Acid, Venous: 1.5 mmol/L (ref 0.5–1.9)
Lactic Acid, Venous: 1.3 mmol/L (ref 0.5–1.9)

## 2023-08-09 LAB — TSH: TSH: 1.42 u[IU]/mL (ref 0.350–4.500)

## 2023-08-09 LAB — MAGNESIUM: Magnesium: 2.2 mg/dL (ref 1.7–2.4)

## 2023-08-09 MED ORDER — METOPROLOL SUCCINATE ER 50 MG PO TB24
50.0000 mg | ORAL_TABLET | Freq: Every day | ORAL | Status: DC
  Administered 2023-08-09: 50 mg via ORAL
  Filled 2023-08-09: qty 1

## 2023-08-09 MED ORDER — ONDANSETRON HCL 4 MG/2ML IJ SOLN: 4.0000 mg | Freq: Four times a day (QID) | INTRAMUSCULAR | Status: DC | PRN

## 2023-08-09 MED ORDER — ACETAMINOPHEN 325 MG PO TABS: 650.0000 mg | ORAL_TABLET | Freq: Four times a day (QID) | ORAL | Status: DC | PRN

## 2023-08-09 MED ORDER — GUAIFENESIN-DM 100-10 MG/5ML PO SYRP: 5.0000 mL | ORAL_SOLUTION | Freq: Once | ORAL | Status: DC

## 2023-08-09 MED ORDER — DILTIAZEM LOAD VIA INFUSION
10.0000 mg | Freq: Once | INTRAVENOUS | Status: AC
  Administered 2023-08-09: 10 mg via INTRAVENOUS
  Filled 2023-08-09: qty 10

## 2023-08-09 MED ORDER — ONDANSETRON HCL 4 MG PO TABS: 4.0000 mg | ORAL_TABLET | Freq: Four times a day (QID) | ORAL | Status: DC | PRN

## 2023-08-09 MED ORDER — DILTIAZEM HCL-DEXTROSE 125-5 MG/125ML-% IV SOLN (PREMIX)
5.0000 mg/h | INTRAVENOUS | Status: DC
  Administered 2023-08-09: 5 mg/h via INTRAVENOUS
  Filled 2023-08-09 (×2): qty 125

## 2023-08-09 MED ORDER — ROSUVASTATIN CALCIUM 20 MG PO TABS
20.0000 mg | ORAL_TABLET | Freq: Every day | ORAL | Status: DC
  Administered 2023-08-09 – 2023-08-16 (×8): 20 mg via ORAL
  Filled 2023-08-09 (×8): qty 1

## 2023-08-09 MED ORDER — ACETAMINOPHEN 650 MG RE SUPP
650.0000 mg | Freq: Four times a day (QID) | RECTAL | Status: DC | PRN
Start: 2023-08-09 — End: 2023-08-17

## 2023-08-09 MED ORDER — GUAIFENESIN-DM 100-10 MG/5ML PO SYRP
5.0000 mL | ORAL_SOLUTION | Freq: Four times a day (QID) | ORAL | Status: DC | PRN
  Administered 2023-08-09 – 2023-08-11 (×5): 5 mL via ORAL
  Filled 2023-08-09 (×5): qty 5

## 2023-08-09 MED ORDER — POLYETHYLENE GLYCOL 3350 17 G PO PACK
17.0000 g | PACK | Freq: Every day | ORAL | Status: DC | PRN
  Administered 2023-08-14: 17 g via ORAL
  Filled 2023-08-09: qty 1

## 2023-08-09 MED ORDER — TECHNETIUM TO 99M ALBUMIN AGGREGATED
4.1000 | Freq: Once | INTRAVENOUS | Status: AC | PRN
  Administered 2023-08-09: 4.1 via INTRAVENOUS

## 2023-08-09 MED ORDER — IPRATROPIUM-ALBUTEROL 0.5-2.5 (3) MG/3ML IN SOLN
3.0000 mL | Freq: Once | RESPIRATORY_TRACT | Status: AC
  Administered 2023-08-09: 3 mL via RESPIRATORY_TRACT

## 2023-08-09 MED ORDER — APIXABAN 5 MG PO TABS
5.0000 mg | ORAL_TABLET | Freq: Two times a day (BID) | ORAL | Status: DC
  Administered 2023-08-09 – 2023-08-17 (×16): 5 mg via ORAL
  Filled 2023-08-09 (×16): qty 1

## 2023-08-09 NOTE — ED Provider Notes (Signed)
[facility]    CSN: [id number] Arrival date & time: 08/09/23  0831      History   Chief Complaint Chief Complaint  Patient presents with   Shortness of Breath    HPI Juan Arnold is a 89 y.o. male.   The history is provided by the patient and a friend.   Patient presents for complaints of shortness of breath and cough is been present for the past 2 weeks.  Patient denies fever, chills, chest pain, abdominal pain, nausea, vomiting, diarrhea, or rash.  Patient's friend states patient's cough appears to be worsening.  She states that patient has complained of shortness of breath, patient states that he gets short of breath easily with any exertion.  Patient with prior history of DVT, he is currently on Eliquis. Past Medical History:  Diagnosis Date   Aortic atherosclerosis (HCC)    Bilateral inguinal hernia    Bradycardia    CAD (coronary artery disease) 07/23/2021   a.) LHC 07/23/2021: 40% mLM, 30% p-mLAD, 20/20% pRCA, 30% mRCA - med mgmt   CKD (chronic kidney disease) stage 3, GFR 30-59 ml/min (HCC)    Colon cancer (HCC)    a.) s/p partial colectomy (LAR); "they removed 14-17 inches of my colon"   DVT femoral (deep venous thrombosis) with thrombophlebitis, left (HCC) 02/19/2023   a.) lower extremity duplex 02/19/2023: LEFT calf DVT and superficial venous thrombophlebitis   Dyspnea    Edema    GERD (gastroesophageal reflux disease)    Heart murmur    History of bilateral cataract extraction 2018   HLD (hyperlipidemia)    HOH; uses BILATERAL hearing aids    Hypertension    IBS (irritable bowel syndrome)    Non-traumatic compression fracture of L3 lumbar vertebra (HCC)    On apixaban therapy    Palpitations (PVC/PACs)    Pre-diabetes    PUD (peptic ulcer disease)    RBBB (right bundle branch block)    Renal cyst    SA node dysfunction (HCC)    Secondary hyperparathyroidism (HCC)    Valvular insufficiency 05/06/2021   a.) TTE 05/06/2021: EF >55%, mild LVH,  sev LAE, mod RAE, triv AR, mod PR, mod-sev TR, sev MR, RVSP 71.8    There are no active problems to display for this patient.   Past Surgical History:  Procedure Laterality Date   APPENDECTOMY  1955   ruptured!   CATARACT EXTRACTION W/PHACO Left 10/29/2016   Procedure: CATARACT EXTRACTION PHACO AND INTRAOCULAR LENS PLACEMENT (IOC);  Surgeon: Sallee Lange, MD;  Location: [facility];  Service: Ophthalmology;  Laterality: Left;  [place] 2:05.4 AP% 25.3 CDE 49.50 FLUID PACK LOT # [id number] H   CATARACT EXTRACTION W/PHACO Right 11/26/2016   Procedure: CATARACT EXTRACTION PHACO AND INTRAOCULAR LENS PLACEMENT (IOC);  Surgeon: Sallee Lange, MD;  Location: [facility];  Service: Ophthalmology;  Laterality: Right;  [place] 1:36.9 AP% 22.2 CDE 33.97 FLUID PACK LOT # [id number] H   COLECTOMY     INGUINAL HERNIA REPAIR Right 1954   LEFT HEART CATH AND CORONARY ANGIOGRAPHY N/A 07/23/2021   Procedure: LEFT HEART CATH AND CORONARY ANGIOGRAPHY;  Surgeon: Marcina Millard, MD;  Location: [facility];  Service: Cardiovascular;  Laterality: N/A;   PARTIAL COLECTOMY  2006   PILONIDAL CYST EXCISION         Home Medications    Prior to Admission medications   Medication Sig Start Date End Date Taking? Authorizing Provider  apixaban (ELIQUIS) 5 MG TABS tablet Take 5 mg  by mouth 2 (two) times daily.   Yes [provider]  metoprolol succinate (TOPROL-XL) 25 MG 24 hr tablet Take 25 mg by mouth at bedtime.    Yes [provider]  rosuvastatin (CRESTOR) 20 MG tablet Take 20 mg by mouth at bedtime.    Yes [provider]  traMADol (ULTRAM) 50 MG tablet Take 1 tablet (50 mg total) by mouth 2 (two) times daily as needed for severe pain (pain score 7-10). 04/23/23  Yes Sakai, Isami, DO  acetaminophen (TYLENOL) 650 MG CR tablet Take 650 mg by mouth every 8 (eight) hours as needed for pain.    [provider]  cholestyramine (QUESTRAN) 4 g packet Take 4 g by mouth 2 (two)  times daily.    [provider]    Family History History reviewed. No pertinent family history.  Social History Social History   Tobacco Use   Smoking status: Former    Current packs/day: 0.00    Types: Cigarettes    Quit date: 07/07/1993    Years since quitting: 30.1   Smokeless tobacco: Never  Vaping Use   Vaping status: Never Used  Substance Use Topics   Alcohol use: No   Drug use: Never     Allergies   Plasticized base [plastibase]   Review of Systems Review of Systems Per HPI  Physical Exam Triage Vital Signs ED Triage Vitals  Encounter Vitals Group     BP 08/09/23 0901 (!) 153/108     Systolic BP Percentile --      Diastolic BP Percentile --      Pulse Rate 08/09/23 0855 (!) 107     Resp 08/09/23 0855 (!) 22     Temp 08/09/23 0901 98 F (36.7 C)     Temp src --      SpO2 08/09/23 0851 92 %     Weight --      Height --      Head Circumference --      Peak Flow --      Pain Score 08/09/23 0851 0     Pain Loc --      Pain Education --      Exclude from Growth Chart --    No data found.  Updated Vital Signs BP (!) 153/108   Pulse (!) 107   Temp 98 F (36.7 C)   Resp (!) 24   SpO2 98%   Visual Acuity Right Eye Distance:   Left Eye Distance:   Bilateral Distance:    Right Eye Near:   Left Eye Near:    Bilateral Near:     Physical Exam Vitals and nursing note reviewed.  Constitutional:      General: He is in acute distress.  HENT:     Head: Normocephalic.  Eyes:     Extraocular Movements: Extraocular movements intact.     Pupils: Pupils are equal, round, and reactive to light.  Cardiovascular:     Rate and Rhythm: Regular rhythm. Tachycardia present.     Pulses: Normal pulses.     Heart sounds: Normal heart sounds.  Pulmonary:     Effort: Pulmonary effort is normal.     Breath sounds: Examination of the right-upper field reveals wheezing. Examination of the left-upper field reveals wheezing. Examination of the right-lower  field reveals wheezing. Examination of the left-lower field reveals wheezing. Wheezing present.  Abdominal:     General: Bowel sounds are normal.     Palpations: Abdomen is  soft.  Musculoskeletal:     Cervical back: Normal range of motion.  Skin:    General: Skin is warm and dry.  Neurological:     General: No focal deficit present.     Mental Status: He is alert and oriented to person, place, and time.  Psychiatric:        Mood and Affect: Mood normal.        Behavior: Behavior normal.     UC Treatments / Results  Labs (all labs ordered are listed, but only abnormal results are displayed) Labs Reviewed - No data to display  EKG   Radiology   Procedures Procedures (including critical care time)  Medications Ordered in UC Medications  ipratropium-albuterol (DUONEB) 0.5-2.5 (3) MG/3ML nebulizer solution 3 mL (3 mLs Nebulization Given 08/09/23 0912)    Initial Impression / Assessment and Plan / UC Course  I have reviewed the triage vital signs and the nursing notes.  Pertinent labs & imaging results that were available during my care of the patient were reviewed by me and considered in my medical decision making (see chart for details).  Patient with complaints of shortness of breath that is been present for the past 2 weeks. On initial presentation, room air sats at 74 to 75%. Patient was started on a DuoNeb, and O2 via nasal cannula at 2 L. Sats with good improvement. There is concern based on patient's initial presentation, this provider is uncertain as to how patient will do if he were to be discharged home. Advised patient that given his initial presentation, it is recommended that he be transported to the emergency department via EMS. Patient was in agreement with this plan of care and verbalized understanding. EMS present for transport to the emergency department.    Final Clinical Impressions(s) / UC Diagnoses   Final diagnoses:  None   Discharge Instructions    None    ED Prescriptions   None    PDMP not reviewed this encounter.   Abran Cantor, NP 08/09/23 [phone]

## 2023-08-09 NOTE — Assessment & Plan Note (Signed)
CKD 3B.  Creatinine 1.6 close to baseline.

## 2023-08-09 NOTE — ED Notes (Addendum)
 Juan Arnold

## 2023-08-09 NOTE — ED Triage Notes (Signed)
Pt went to UC this morning with complaints of SOB, cough, and weakness. Pt is active at baseline. Pt has hx of afib. Pt is currently in rapid afib with a rate of 135. Pt is maintaining o2 sat of 97% on room air. Pt received 1 douneb at [facility]   Pr reports these symptoms started after a procedure requiring contrast dye for a blood clot in his left leg. Pt is currently taking eliquis for treatment of blood clot.

## 2023-08-09 NOTE — Assessment & Plan Note (Addendum)
New onset atrial fibrillation.  EDP reports heart rate up to 150s on presentation.  No Echo on file.  On anticoagulation for DVT- with Eliquis, also on metoprolol.  Troponin 25>> 23.  BNP elevated 1193, but no peripheral signs of edema. - 10mg  bolus and then Cardizem drip started in ED, continue -Check TSH, Mag -Echocardiogram -Continue Eliquis- -Resume home metoprolol at increased dose 50 mg daily

## 2023-08-09 NOTE — Assessment & Plan Note (Signed)
Stable. -Continue metoprolol at increased dose 50 mg daily

## 2023-08-09 NOTE — ED Provider Notes (Signed)
[facility]    CSN: [id number] Arrival date & time: 08/09/23  0930      History   Chief Complaint Chief Complaint  Patient presents with   Shortness of Breath    HPI DELNO BLAISDELL is a 89 y.o. male.   The history is provided by the patient.   Patient presents for complaints of shortness of breath and cough for the past 2 weeks.  Patient brought in by a friend who states that she has noticed that patient's cough is worsened and he has been more short of breath.  Patient states that when he is up and moves around, he becomes short of breath.  He states he also has some mild shortness of breath while sitting.  Denies fever, chills, chest pain, difficulty breathing, abdominal pain.  Patient denies prior history of COPD or asthma.  Patient with history of DVT, currently on Eliquis.  During triage, initial sats noted to be between 74 to 75%.  Patient is speaking in complete sentences. Past Medical History:  Diagnosis Date   Aortic atherosclerosis (HCC)    Bilateral inguinal hernia    Bradycardia    CAD (coronary artery disease) 07/23/2021   a.) LHC 07/23/2021: 40% mLM, 30% p-mLAD, 20/20% pRCA, 30% mRCA - med mgmt   CKD (chronic kidney disease) stage 3, GFR 30-59 ml/min (HCC)    Colon cancer (HCC)    a.) s/p partial colectomy (LAR); "they removed 14-17 inches of my colon"   DVT femoral (deep venous thrombosis) with thrombophlebitis, left (HCC) 02/19/2023   a.) lower extremity duplex 02/19/2023: LEFT calf DVT and superficial venous thrombophlebitis   Dyspnea    Edema    GERD (gastroesophageal reflux disease)    Heart murmur    History of bilateral cataract extraction 2018   HLD (hyperlipidemia)    HOH; uses BILATERAL hearing aids    Hypertension    IBS (irritable bowel syndrome)    Non-traumatic compression fracture of L3 lumbar vertebra (HCC)    On apixaban therapy    Palpitations (PVC/PACs)    Pre-diabetes    PUD (peptic ulcer disease)    RBBB (right bundle  branch block)    Renal cyst    SA node dysfunction (HCC)    Secondary hyperparathyroidism (HCC)    Valvular insufficiency 05/06/2021   a.) TTE 05/06/2021: EF >55%, mild LVH, sev LAE, mod RAE, triv AR, mod PR, mod-sev TR, sev MR, RVSP 71.8    There are no active problems to display for this patient.   Past Surgical History:  Procedure Laterality Date   APPENDECTOMY  1955   ruptured!   CATARACT EXTRACTION W/PHACO Left 10/29/2016   Procedure: CATARACT EXTRACTION PHACO AND INTRAOCULAR LENS PLACEMENT (IOC);  Surgeon: Sallee Lange, MD;  Location: [facility];  Service: Ophthalmology;  Laterality: Left;  [place] 2:05.4 AP% 25.3 CDE 49.50 FLUID PACK LOT # [id number] H   CATARACT EXTRACTION W/PHACO Right 11/26/2016   Procedure: CATARACT EXTRACTION PHACO AND INTRAOCULAR LENS PLACEMENT (IOC);  Surgeon: Sallee Lange, MD;  Location: [facility];  Service: Ophthalmology;  Laterality: Right;  [place] 1:36.9 AP% 22.2 CDE 33.97 FLUID PACK LOT # [id number] H   COLECTOMY     INGUINAL HERNIA REPAIR Right 1954   LEFT HEART CATH AND CORONARY ANGIOGRAPHY N/A 07/23/2021   Procedure: LEFT HEART CATH AND CORONARY ANGIOGRAPHY;  Surgeon: Marcina Millard, MD;  Location: [facility];  Service: Cardiovascular;  Laterality: N/A;   PARTIAL COLECTOMY  2006   PILONIDAL CYST EXCISION  Home Medications    Prior to Admission medications   Medication Sig Start Date End Date Taking? Authorizing Provider  acetaminophen (TYLENOL) 650 MG CR tablet Take 650 mg by mouth every 8 (eight) hours as needed for pain.    [provider]  apixaban (ELIQUIS) 5 MG TABS tablet Take 5 mg by mouth 2 (two) times daily.    [provider]  cholestyramine (QUESTRAN) 4 g packet Take 4 g by mouth 2 (two) times daily.    [provider]  metoprolol succinate (TOPROL-XL) 25 MG 24 hr tablet Take 25 mg by mouth at bedtime.     [provider]  rosuvastatin (CRESTOR) 20 MG tablet Take 20 mg by  mouth at bedtime.     [provider]  traMADol (ULTRAM) 50 MG tablet Take 1 tablet (50 mg total) by mouth 2 (two) times daily as needed for severe pain (pain score 7-10). 04/23/23   Sung Amabile, DO    Family History History reviewed. No pertinent family history.  Social History Social History   Tobacco Use   Smoking status: Former    Current packs/day: 0.00    Types: Cigarettes    Quit date: 07/07/1993    Years since quitting: 30.1   Smokeless tobacco: Never  Vaping Use   Vaping status: Never Used  Substance Use Topics   Alcohol use: No   Drug use: Never     Allergies   Plasticized base [plastibase]   Review of Systems Review of Systems Per HPI  Physical Exam Triage Vital Signs ED Triage Vitals [08/09/23 0944]  Encounter Vitals Group     BP      Systolic BP Percentile      Diastolic BP Percentile      Pulse      Resp      Temp      Temp src      SpO2      Weight 180 lb (81.6 kg)     Height 6' (1.829 m)     Head Circumference      Peak Flow      Pain Score 0     Pain Loc      Pain Education      Exclude from Growth Chart    No data found.  Updated Vital Signs Ht 6' (1.829 m)   Wt 180 lb (81.6 kg)   BMI 24.41 kg/m   Visual Acuity Right Eye Distance:   Left Eye Distance:   Bilateral Distance:    Right Eye Near:   Left Eye Near:    Bilateral Near:     Physical Exam Vitals and nursing note reviewed.  Constitutional:      General: He is in acute distress.     Appearance: He is well-developed.  HENT:     Head: Normocephalic.  Eyes:     Extraocular Movements: Extraocular movements intact.     Conjunctiva/sclera: Conjunctivae normal.     Pupils: Pupils are equal, round, and reactive to light.  Cardiovascular:     Rate and Rhythm: Regular rhythm.     Pulses: Normal pulses.     Heart sounds: Normal heart sounds.  Pulmonary:     Effort: Respiratory distress present.     Breath sounds: No stridor. Wheezing present. No rhonchi or  rales.  Chest:     Chest wall: No tenderness.  Abdominal:     General: Bowel sounds are normal.     Palpations: Abdomen is soft.  Tenderness: There is no abdominal tenderness.  Musculoskeletal:     Cervical back: Normal range of motion.  Skin:    General: Skin is warm and dry.  Neurological:     General: No focal deficit present.     Mental Status: He is alert and oriented to person, place, and time.  Psychiatric:        Mood and Affect: Mood normal.        Behavior: Behavior normal.      UC Treatments / Results  Labs (all labs ordered are listed, but only abnormal results are displayed) Labs Reviewed  CULTURE, BLOOD (ROUTINE X 2)  CULTURE, BLOOD (ROUTINE X 2)  BASIC METABOLIC PANEL  CBC  BRAIN NATRIURETIC PEPTIDE  LACTIC ACID, PLASMA  LACTIC ACID, PLASMA  TROPONIN I (HIGH SENSITIVITY)    EKG   Radiology DG Chest Port 1 View Result Date: 08/09/2023 CLINICAL DATA:  Shortness of breath, cough, weakness EXAM: PORTABLE CHEST 1 VIEW COMPARISON:  CT chest 05/18/2023 FINDINGS: Moderate to prominent cardiomegaly. Apical lordotic projection. Emphysema. Faintly accentuated interstitial accentuation in the lungs with some mild indistinctness of pulmonary vasculature, query pulmonary venous hypertension. Subtle blunting of the left lateral costophrenic angle. Degenerative glenohumeral arthropathy bilaterally. Thoracic spondylosis. IMPRESSION: 1. Moderate to prominent cardiomegaly with faintly accentuated interstitial accentuation in the lungs and some mild indistinctness of pulmonary vasculature, query pulmonary venous hypertension. 2. Subtle blunting of the left lateral costophrenic angle, possibly a small effusion. 3. Emphysema. 4. Degenerative glenohumeral arthropathy bilaterally. Electronically Signed   By: Gaylyn Rong M.D.   On: 08/09/2023 10:33    Procedures Procedures (including critical care time)  Medications Ordered in UC   Initial Impression / Assessment and  Plan / UC Course  I have reviewed the triage vital signs and the nursing notes.  Pertinent labs & imaging results that were available during my care of the patient were reviewed by me and considered in my medical decision making (see chart for details).  Patient with complaints of shortness of breath that is been present for the past 2 weeks.  On initial presentation, room air sats at 74 to 75%.  Patient was started on a DuoNeb, and O2 via nasal cannula at 2 L.  Sats with good improvement.  There is concern based on patient's initial presentation, this provider is uncertain as to how patient will do if he were to be discharged home.  Advised patient that given his initial presentation, it is recommended that he be transported to the emergency department via EMS.  Patient was in agreement with this plan of care and verbalized understanding.  EMS present for transport to the emergency department. Final Clinical Impressions(s) / UC Diagnoses   Final diagnoses:  None   Discharge Instructions   None    ED Prescriptions   None    PDMP not reviewed this encounter.   Abran Cantor, NP 08/09/23 [phone]
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
## 2023-08-09 NOTE — Assessment & Plan Note (Signed)
-  Continue Eliquis

## 2023-08-09 NOTE — ED Notes (Signed)
Report to [facility] CN Lequita Halt

## 2023-08-09 NOTE — H&P (Addendum)
History and Physical    Juan SWEETSER Arnold:[id number] DOB: July 27, 1934 DOA: 08/09/2023  PCP: Lauro Regulus, MD   Patient coming from: Home  I have personally briefly reviewed patient's old medical records in [facility]  Chief Complaint: Difficulty Breathing  HPI: Juan Arnold is a 89 y.o. male with medical history significant for hypertension, DVT, peptic ulcer disease, colon cancer, CKD 3, coronary artery disease. Patient presented to the ED with complaints of difficulty breathing worse with exertion and improves with rest, that started about a week ago, also with accompanying fatigue.  Over the past 3 days symptoms have significantly worsened.  Also reports of worsening cough over the past 2 weeks.  No lower extremity swelling, no chest pain.  No palpitations.  No history of atrial fibrillation.  He was started on Eliquis for DVT about 2 to 3 months ago and he reports compliance.  Reports remote smoking history.  ED Course: Tmax 98.6.  Heart rate 79-97.  Respiratory rate 16-26.  Blood pressures 100- 130.  O2 sats > 97% on room air. BNP 1193.  Lactic acid 1.5.  Troponin 25. Chest x-ray questions pulmonary venous hypertension, possibly small pleural effusion, emphysema. Cardizem 10mg  and then bolus started. EDP wanted to get CTA done, patient did want exposure to contrast, hence VQ scan ordered-negative for acute PE.  Review of Systems: As per HPI all other systems reviewed and negative.  Past Medical History:  Diagnosis Date   Aortic atherosclerosis (HCC)    Bilateral inguinal hernia    Bradycardia    CAD (coronary artery disease) 07/23/2021   a.) LHC 07/23/2021: 40% mLM, 30% p-mLAD, 20/20% pRCA, 30% mRCA - med mgmt   CKD (chronic kidney disease) stage 3, GFR 30-59 ml/min (HCC)    Colon cancer (HCC)    a.) s/p partial colectomy (LAR); "they removed 14-17 inches of my colon"   DVT femoral (deep venous thrombosis) with thrombophlebitis, left (HCC) 02/19/2023   a.) lower  extremity duplex 02/19/2023: LEFT calf DVT and superficial venous thrombophlebitis   Dyspnea    Edema    GERD (gastroesophageal reflux disease)    Heart murmur    History of bilateral cataract extraction 2018   HLD (hyperlipidemia)    HOH; uses BILATERAL hearing aids    Hypertension    IBS (irritable bowel syndrome)    Non-traumatic compression fracture of L3 lumbar vertebra (HCC)    On apixaban therapy    Palpitations (PVC/PACs)    Pre-diabetes    PUD (peptic ulcer disease)    RBBB (right bundle branch block)    Renal cyst    SA node dysfunction (HCC)    Secondary hyperparathyroidism (HCC)    Valvular insufficiency 05/06/2021   a.) TTE 05/06/2021: EF >55%, mild LVH, sev LAE, mod RAE, triv AR, mod PR, mod-sev TR, sev MR, RVSP 71.8    Past Surgical History:  Procedure Laterality Date   APPENDECTOMY  1955   ruptured!   CATARACT EXTRACTION W/PHACO Left 10/29/2016   Procedure: CATARACT EXTRACTION PHACO AND INTRAOCULAR LENS PLACEMENT (IOC);  Surgeon: Sallee Lange, MD;  Location: [facility] ORS;  Service: Ophthalmology;  Laterality: Left;  [place] 2:05.4 AP% 25.3 CDE 49.50 FLUID PACK LOT # [id number]   CATARACT EXTRACTION W/PHACO Right 11/26/2016   Procedure: CATARACT EXTRACTION PHACO AND INTRAOCULAR LENS PLACEMENT (IOC);  Surgeon: Sallee Lange, MD;  Location: [facility] ORS;  Service: Ophthalmology;  Laterality: Right;  [place] 1:36.9 AP% 22.2 CDE 33.97 FLUID PACK LOT # [id number]   COLECTOMY  INGUINAL HERNIA REPAIR Right 1954   LEFT HEART CATH AND CORONARY ANGIOGRAPHY N/A 07/23/2021   Procedure: LEFT HEART CATH AND CORONARY ANGIOGRAPHY;  Surgeon: Marcina Millard, MD;  Location: [facility] INVASIVE CV LAB;  Service: Cardiovascular;  Laterality: N/A;   PARTIAL COLECTOMY  2006   PILONIDAL CYST EXCISION       reports that he quit smoking about 30 years ago. His smoking use included cigarettes. He has never used smokeless tobacco. He reports that he does not drink alcohol and does not use  drugs.  Allergies  Allergen Reactions   Plasticized Base [Plastibase]     Pt states he had "blood clot in arm from a plastic device"   Family history of hypertension.  Prior to Admission medications   Medication Sig Start Date End Date Taking? Authorizing Provider  acetaminophen (TYLENOL) 650 MG CR tablet Take 650 mg by mouth every 8 (eight) hours as needed for pain.    [provider]  apixaban (ELIQUIS) 5 MG TABS tablet Take 5 mg by mouth 2 (two) times daily.    [provider]  cholestyramine (QUESTRAN) 4 g packet Take 4 g by mouth 2 (two) times daily.    [provider]  metoprolol succinate (TOPROL-XL) 25 MG 24 hr tablet Take 25 mg by mouth at bedtime.     [provider]  rosuvastatin (CRESTOR) 20 MG tablet Take 20 mg by mouth at bedtime.     [provider]  traMADol (ULTRAM) 50 MG tablet Take 1 tablet (50 mg total) by mouth 2 (two) times daily as needed for severe pain (pain score 7-10). 04/23/23   Sung Amabile, DO   Physical Exam: Vitals:   08/09/23 1230 08/09/23 1245 08/09/23 1300 08/09/23 1330  BP: 122/84 (!) 129/91 119/84 127/79  Pulse: 87 89 79 84  Resp: 18 (!) 22 (!) 21 18  SpO2: 99% 100% 98% 98%  Weight:      Height:        Constitutional: NAD, calm, comfortable Vitals:   08/09/23 1230 08/09/23 1245 08/09/23 1300 08/09/23 1330  BP: 122/84 (!) 129/91 119/84 127/79  Pulse: 87 89 79 84  Resp: 18 (!) 22 (!) 21 18  SpO2: 99% 100% 98% 98%  Weight:      Height:       Eyes: PERRL, lids and conjunctivae normal ENMT: Mucous membranes are moist.  Neck: normal, supple, no masses, no thyromegaly Respiratory: clear to auscultation bilaterally, no wheezing, no crackles. Normal respiratory effort. No accessory muscle use.  Cardiovascular: Regular rate and rhythm, no murmurs / rubs / gallops. No extremity edema.   Extremities warm. Abdomen: no tenderness, no masses palpated. No hepatosplenomegaly. Bowel sounds positive.   Musculoskeletal: no clubbing / cyanosis. No joint deformity upper and lower extremities.  Skin: no rashes, lesions, ulcers. No induration Neurologic: No facial assymmetry, moving extremities spontaneously, speech fluent Psychiatric: Normal judgment and insight. Alert and oriented x 3. Normal mood.   Labs on Admission: I have personally reviewed following labs and imaging studies  CBC: Recent Labs  Lab 08/09/23 1007  WBC 8.6  HGB 12.1*  HCT 38.9*  MCV 102.9*  PLT 206   Basic Metabolic Panel: Recent Labs  Lab 08/09/23 1007  NA 142  K 4.5  CL 111  CO2 22  GLUCOSE 138*  BUN 34*  CREATININE 1.63*  CALCIUM 8.7*   Radiological Exams on Admission: DG Chest Port 1 View Result Date: 08/09/2023 CLINICAL DATA:  Shortness of breath, cough, weakness EXAM:  PORTABLE CHEST 1 VIEW COMPARISON:  CT chest 05/18/2023 FINDINGS: Moderate to prominent cardiomegaly. Apical lordotic projection. Emphysema. Faintly accentuated interstitial accentuation in the lungs with some mild indistinctness of pulmonary vasculature, query pulmonary venous hypertension. Subtle blunting of the left lateral costophrenic angle. Degenerative glenohumeral arthropathy bilaterally. Thoracic spondylosis. IMPRESSION: 1. Moderate to prominent cardiomegaly with faintly accentuated interstitial accentuation in the lungs and some mild indistinctness of pulmonary vasculature, query pulmonary venous hypertension. 2. Subtle blunting of the left lateral costophrenic angle, possibly a small effusion. 3. Emphysema. 4. Degenerative glenohumeral arthropathy bilaterally. Electronically Signed   By: Gaylyn Rong M.D.   On: 08/09/2023 10:33    EKG: Independently reviewed.  Atrial fibrillation, rate 122, QTc 531.  RBBB.   No recent EKG to compare.  Assessment/Plan Principal Problem:   Atrial fibrillation with RVR (HCC) Active Problems:   DVT (deep venous thrombosis) (HCC)   Colon cancer (HCC)   CKD (chronic kidney disease) stage 3,  GFR 30-59 ml/min (HCC)   HTN (hypertension)   RBBB  Assessment and Plan: * Atrial fibrillation with RVR (HCC) New onset atrial fibrillation.  EDP reports heart rate up to 150s on presentation.  No Echo on file.  On anticoagulation for DVT- with Eliquis, also on metoprolol.  Troponin 25>> 23.  BNP elevated 1193, but no peripheral signs of edema. - 10mg  bolus and then Cardizem drip started in ED, continue -Check TSH, Mag -Echocardiogram -Continue Eliquis- -Resume home metoprolol at increased dose 50 mg daily   HTN (hypertension) Stable. -Continue metoprolol at increased dose 50 mg daily  CKD (chronic kidney disease) stage 3, GFR 30-59 ml/min (HCC) CKD 3B.  Creatinine 1.6 close to baseline.  DVT (deep venous thrombosis) (HCC) Continue Eliquis  Dyspnea, generalized weakness, cough- COVID test negative, chest x-ray questionable pulmonary venous hypertension, possible small left pleural effusion.  BNP elevated at 1193-in setting of CKD 3, no prior to compare.  Not hypoxic.  No peripheral signs of volume overload. -VQ scan ordered in ED negative for PE. - If worsening symptoms considered further workup with chest imaging without contrast   DVT prophylaxis: Eliquis Code Status: DNI-confirmed with patient and spouse at bedside.  They have a living will stating same. Family Communication: Spouse at bedside Disposition Plan: ~ 2 days Consults called: None Admission status: Inpt STepdown I certify that at the point of admission it is my clinical judgment that the patient will require inpatient hospital care spanning beyond 2 midnights from the point of admission due to high intensity of service, high risk for further deterioration and high frequency of surveillance required.   CRITICAL CARE Performed by: Onnie Boer   Total critical care time: 70 minutes  Critical care time was exclusive of separately billable procedures and treating other patients.  Critical care was  necessary to treat or prevent imminent or life-threatening deterioration.  Critical care was time spent personally by me on the following activities: development of treatment plan with patient and/or surrogate as well as nursing, discussions with consultants, evaluation of patient's response to treatment, examination of patient, obtaining history from patient or surrogate, ordering and performing treatments and interventions, ordering and review of laboratory studies, ordering and review of radiographic studies, pulse oximetry and re-evaluation of patient's condition.   Author: Onnie Boer, MD 08/09/2023 5:16 PM  For on call review [email or web address].

## 2023-08-09 NOTE — ED Provider Notes (Addendum)
[place] EMERGENCY DEPARTMENT AT [facility] Provider Note   CSN: [id number] Arrival date & time: 08/09/23  0930     History  Chief Complaint  Patient presents with   Shortness of Breath    Juan Arnold is a 89 y.o. male.  89 year old male with past medical history of DVT and hyperlipidemia presenting to the emergency department today with shortness of breath.  The patient states that he has been having increasing dyspnea over the past month or so.  He denies any associated chest pain with this.  He has had a cough.  This has been minimally productive.  The patient is been afebrile.  He has not had any hemoptysis.  He has been taking his Eliquis as prescribed.  He has noted some palpitations as well as intermittent lightheadedness.   Shortness of Breath Associated symptoms: cough        Home Medications Prior to Admission medications   Medication Sig Start Date End Date Taking? Authorizing Provider  acetaminophen (TYLENOL) 650 MG CR tablet Take 650 mg by mouth every 8 (eight) hours as needed for pain.    [provider]  apixaban (ELIQUIS) 5 MG TABS tablet Take 5 mg by mouth 2 (two) times daily.    [provider]  cholestyramine (QUESTRAN) 4 g packet Take 4 g by mouth 2 (two) times daily.    [provider]  metoprolol succinate (TOPROL-XL) 25 MG 24 hr tablet Take 25 mg by mouth at bedtime.     [provider]  rosuvastatin (CRESTOR) 20 MG tablet Take 20 mg by mouth at bedtime.     [provider]  traMADol (ULTRAM) 50 MG tablet Take 1 tablet (50 mg total) by mouth 2 (two) times daily as needed for severe pain (pain score 7-10). 04/23/23   Sung Amabile, DO      Allergies    Plasticized base [plastibase]    Review of Systems   Review of Systems  Respiratory:  Positive for cough and shortness of breath.   All other systems reviewed and are negative.   Physical Exam Updated Vital Signs BP 127/79   Pulse 84    Resp 18   Ht 6' (1.829 m)   Wt 81.6 kg   SpO2 98%   BMI 24.41 kg/m  Physical Exam Vitals and nursing note reviewed.   Gen: NAD Eyes: PERRL, EOMI HEENT: no oropharyngeal swelling Neck: trachea midline Resp: clear to auscultation bilaterally although diminished at bilateral lung bases Card: Tachycardic, irregularly irregular, no murmurs, rubs, or gallops Abd: nontender, nondistended Extremities: no calf tenderness, no edema Vascular: 2+ radial pulses bilaterally, 2+ DP pulses bilaterally Neuro: No focal deficits Skin: no rashes Psyc: acting appropriately   ED Results / Procedures / Treatments   Labs (all labs ordered are listed, but only abnormal results are displayed) Labs Reviewed  BASIC METABOLIC PANEL - Abnormal; Notable for the following components:      Result Value   Glucose, Bld 138 (*)    BUN 34 (*)    Creatinine, Ser 1.63 (*)    Calcium 8.7 (*)    GFR, Estimated 41 (*)    All other components within normal limits  CBC - Abnormal; Notable for the following components:   RBC 3.78 (*)    Hemoglobin 12.1 (*)    HCT 38.9 (*)    MCV 102.9 (*)    All other components within normal limits  BRAIN NATRIURETIC PEPTIDE - Abnormal; Notable for the  following components:   B Natriuretic Peptide 1,193.0 (*)    All other components within normal limits  TROPONIN I (HIGH SENSITIVITY) - Abnormal; Notable for the following components:   Troponin I (High Sensitivity) 25 (*)    All other components within normal limits  TROPONIN I (HIGH SENSITIVITY) - Abnormal; Notable for the following components:   Troponin I (High Sensitivity) 23 (*)    All other components within normal limits  CULTURE, BLOOD (ROUTINE X 2)  CULTURE, BLOOD (ROUTINE X 2)  RESP PANEL BY RT-PCR (RSV, FLU A&B, COVID)  [id number]  LACTIC ACID, PLASMA  LACTIC ACID, PLASMA    EKG None  Radiology DG Chest Port 1 View Result Date: 08/09/2023 CLINICAL DATA:  Shortness of breath, cough, weakness EXAM: PORTABLE  CHEST 1 VIEW COMPARISON:  CT chest 05/18/2023 FINDINGS: Moderate to prominent cardiomegaly. Apical lordotic projection. Emphysema. Faintly accentuated interstitial accentuation in the lungs with some mild indistinctness of pulmonary vasculature, query pulmonary venous hypertension. Subtle blunting of the left lateral costophrenic angle. Degenerative glenohumeral arthropathy bilaterally. Thoracic spondylosis. IMPRESSION: 1. Moderate to prominent cardiomegaly with faintly accentuated interstitial accentuation in the lungs and some mild indistinctness of pulmonary vasculature, query pulmonary venous hypertension. 2. Subtle blunting of the left lateral costophrenic angle, possibly a small effusion. 3. Emphysema. 4. Degenerative glenohumeral arthropathy bilaterally. Electronically Signed   By: Gaylyn Rong M.D.   On: 08/09/2023 10:33    Procedures Procedures    Medications Ordered in ED Medications  diltiazem (CARDIZEM) 1 mg/mL load via infusion 10 mg (10 mg Intravenous Bolus from Bag 08/09/23 1058)    And  diltiazem (CARDIZEM) 125 mg in dextrose 5% 125 mL (1 mg/mL) infusion (5 mg/hr Intravenous New Bag/Given 08/09/23 1058)  technetium albumin aggregated (MAA) injection solution 4.1 millicurie (4.1 millicuries Intravenous Contrast Given 08/09/23 1430)    ED Course/ Medical Decision Making/ A&P                                 Medical Decision Making 89 year old male with past medical history of DVT and hypertension on Eliquis presenting to the emergency department today with progressive dyspnea on exertion that has worsened over the past month.  The patient does appear to be in atrial fibrillation with RVR on the monitor here.  I will further evaluate him here with a cardiac workup as well as a BNP.  I did discuss CT angiogram with the patient but he is refusing this test.  He states that he did not feel well after he received contrast dye back and January and will not consent to this at this time.  I  will obtain a troponin and BNP for further evaluation for possible heart strain.  The patient is on Eliquis which should be the treatment for pulmonary embolism.  Will obtain x-ray to evaluate for CHF or pulmonary infiltrates.  Also obtain a COVID and flu swab on the patient.  I will treat the patient's A-fib with RVR with Cardizem bolus and infusion.  Will reevaluate for ultimate disposition.  He will likely require admission.  The patient's heart rate improved on diltiazem.  His troponin is mildly elevated but at his baseline.  BNP is also mildly elevated.  There is some pleural effusions noted on x-ray as well as some pulmonary vascular congestion.  The patient is given a dose of Lasix here.  Calls placed to hospital service for admission.  CRITICAL CARE Performed by:  Durwin Glaze   Total critical care time: 37 minutes  Critical care time was exclusive of separately billable procedures and treating other patients.  Critical care was necessary to treat or prevent imminent or life-threatening deterioration.  Critical care was time spent personally by me on the following activities: development of treatment plan with patient and/or surrogate as well as nursing, discussions with consultants, evaluation of patient's response to treatment, examination of patient, obtaining history from patient or surrogate, ordering and performing treatments and interventions, ordering and review of laboratory studies, ordering and review of radiographic studies, pulse oximetry and re-evaluation of patient's condition.   Amount and/or Complexity of Data Reviewed Labs: ordered. Radiology: ordered.  Risk Prescription drug management. Decision regarding hospitalization.           Final Clinical Impression(s) / ED Diagnoses Final diagnoses:  Atrial fibrillation with RVR [facility])    Rx / DC Orders ED Discharge Orders     None         Durwin Glaze, MD 08/09/23 1444    Durwin Glaze,  MD 08/09/23 [phone]

## 2023-08-09 NOTE — Discharge Instructions (Signed)
Patient transported to the emergency department via EMS. 

## 2023-08-10 ENCOUNTER — Inpatient Hospital Stay (HOSPITAL_COMMUNITY): Payer: Medicare Other

## 2023-08-10 DIAGNOSIS — I4891 Unspecified atrial fibrillation: Secondary | ICD-10-CM | POA: Diagnosis present

## 2023-08-10 LAB — ECHOCARDIOGRAM COMPLETE
Area-P 1/2: 3.68 cm2
Calc EF: 59 %
Est EF: 40
Height: 72 in
MV M vel: 4.69 m/s
MV Peak grad: 88 mm[Hg]
S' Lateral: 2.65 cm
Single Plane A2C EF: 64.2 %
Single Plane A4C EF: 55.2 %
Weight: 2880 [oz_av]

## 2023-08-10 LAB — BASIC METABOLIC PANEL
Anion gap: 9 (ref 5–15)
BUN: 35 mg/dL — ABNORMAL HIGH (ref 8–23)
CO2: 21 mmol/L — ABNORMAL LOW (ref 22–32)
Calcium: 8.6 mg/dL — ABNORMAL LOW (ref 8.9–10.3)
Chloride: 112 mmol/L — ABNORMAL HIGH (ref 98–111)
Creatinine, Ser: 1.72 mg/dL — ABNORMAL HIGH (ref 0.61–1.24)
GFR, Estimated: 38 mL/min — ABNORMAL LOW (ref >60)
Glucose, Bld: 121 mg/dL — ABNORMAL HIGH (ref 70–99)
Potassium: 4.8 mmol/L (ref 3.5–5.1)
Sodium: 142 mmol/L (ref 135–145)

## 2023-08-10 LAB — CBC
HCT: 36.1 % — ABNORMAL LOW (ref 39.0–52.0)
Hemoglobin: 11.3 g/dL — ABNORMAL LOW (ref 13.0–17.0)
MCH: 32.7 pg (ref 26.0–34.0)
MCHC: 31.3 g/dL (ref 30.0–36.0)
MCV: 104.3 fL — ABNORMAL HIGH (ref 80.0–100.0)
Platelets: 190 10*3/uL (ref 150–400)
RBC: 3.46 MIL/uL — ABNORMAL LOW (ref 4.22–5.81)
RDW: 14.4 % (ref 11.5–15.5)
WBC: 8.4 10*3/uL (ref 4.0–10.5)
nRBC: 0 % (ref 0.0–0.2)

## 2023-08-10 MED ORDER — PERFLUTREN LIPID MICROSPHERE
1.0000 mL | INTRAVENOUS | Status: AC | PRN
  Administered 2023-08-10: 1 mL via INTRAVENOUS

## 2023-08-10 MED ORDER — METOPROLOL SUCCINATE ER 25 MG PO TB24
75.0000 mg | ORAL_TABLET | Freq: Every day | ORAL | Status: DC
  Administered 2023-08-10: 75 mg via ORAL
  Filled 2023-08-10: qty 1

## 2023-08-10 MED ORDER — METOPROLOL TARTRATE 5 MG/5ML IV SOLN
5.0000 mg | Freq: Four times a day (QID) | INTRAVENOUS | Status: DC | PRN
  Administered 2023-08-11: 5 mg via INTRAVENOUS
  Filled 2023-08-10: qty 5

## 2023-08-10 MED ORDER — ALBUTEROL SULFATE (2.5 MG/3ML) 0.083% IN NEBU: 2.5000 mg | INHALATION_SOLUTION | RESPIRATORY_TRACT | Status: DC | PRN

## 2023-08-10 MED ORDER — FAMOTIDINE 20 MG PO TABS
20.0000 mg | ORAL_TABLET | Freq: Every day | ORAL | Status: DC | PRN
  Administered 2023-08-13: 20 mg via ORAL
  Filled 2023-08-10: qty 1

## 2023-08-10 MED ORDER — TRAMADOL HCL 50 MG PO TABS
50.0000 mg | ORAL_TABLET | Freq: Four times a day (QID) | ORAL | Status: DC | PRN
  Administered 2023-08-12 – 2023-08-17 (×9): 50 mg via ORAL
  Filled 2023-08-10 (×9): qty 1

## 2023-08-10 NOTE — ED Notes (Signed)
Pt ambulated to restroom and then set up in recliner. Pt SOB with any exertion.

## 2023-08-10 NOTE — Progress Notes (Signed)
   08/10/23 1530  TOC Brief Assessment  Insurance and Status Reviewed  Patient has primary care physician Yes  Home environment has been reviewed from home  Prior level of function: independent  Prior/Current Home Services No current home services  Social Drivers of Health Review SDOH reviewed no interventions necessary  Readmission risk has been reviewed Yes  Transition of care needs no transition of care needs at this time     Transition of Care Department [facility]) has reviewed patient and no TOC needs have been identified at this time. We will continue to monitor patient advancement through interdisciplinary progression rounds. If new patient transition needs arise, please place a TOC consult.

## 2023-08-10 NOTE — Progress Notes (Signed)
PROGRESS NOTE    RULON ABDALLA  ZOX:[id number] DOB: 04-24-1935 DOA: 08/09/2023 PCP: Lauro Regulus, MD    Brief Narrative:   Juan Arnold is a 89 y.o. male with past medical history significant for HTN, history of DVT on Eliquis, CKD stage IIIb, CAD, peptic ulcer disease, history of colon cancer who presented to [facility] ED on 08/09/2023 with progressive shortness of breath over the last week.  Patient reports worse with exertion and associated with fatigue.  Also endorses worsening cough over the last 2 weeks.  Started on Eliquis for DVT 2-3 months ago and reports compliance.  Remote history of tobacco use, currently inactive.  Denies chest pain, no palpitations, no fever/chills, no nausea/vomiting/diarrhea, no abdominal pain, no urinary symptoms.  In the ED, temperature 98.6 F, HR 112, RR 23, BP 118/90, SpO2 97% on room air.  WBC 8.6, hemoglobin 12.1, platelet count 206.  Sodium 142, potassium 4.5, chloride 111, CO2 22, glucose 138, BUN 34, creat 1.63.  BNP 1193.0.  High sensitive troponin 25>23.  Chest x-ray with small pleural effusion, emphysema.  Patient was given Cardizem 10 mg IV bolus followed by continuous infusion.  VQ scan negative for PE.  TRH consulted for admission for further evaluation management of new onset A-fib with RVR.  Assessment & Plan:   Paroxysmal atrial fibrillation with RVR, new diagnosis Patient dying with progressive weakness, dyspnea, fatigue.  Was noted to be in A-fib with RVR with heart rate 132 on EKG.  No previous diagnosis of A-fib.  Currently on Eliquis outpatient for recent diagnosis of DVT.  TSH 1.420, within normal limits. -- TTE: Pending -- Metoprolol succinate increased to 50 mg p.o. daily -- Cardizem drip, attempt to wean off today -- Eliquis 5 mg p.o. twice daily -- Continue monitor on telemetry  Elevated troponin Troponin elevated initially at 25 followed by 23, patient denies chest pain.  Etiology likely secondary to type II  demand ischemia in the setting of A-fib with RVR as above. --Continue monitor on telemetry  Dyspnea Cough Patient complaining of progressive shortness of breath, cough over the last several weeks.  Not hypoxic and not requiring oxygen therapy.  Etiology likely secondary to mild vascular congestion from A-fib with RVR.  COVID, influenza, RSV PCR negative.  BNP elevated 1193.  Chest x-ray with cardiomegaly, questionable left pleural effusion.  Nuclear medicine VQ scan negative for PE. --Ambulatory O2 screen  History of DVT -- Eliquis 5 mg p.o. twice daily  CKD stage IIIb -- Cr 1.63>1.72 (baseline 1.50 05/18/2023), stable -- Avoid nephrotoxins, renally dose all medications -- Repeat BMP in a.m.  CAD -- Crestor 20 mg p.o. daily  Essential hypertension Home regimen includes metoprolol succinate 25 mg p.o. daily, olmesartan 40 mg p.o. daily. -- Metoprolol succinate increased to 50 mg p.o. daily as above -- Hold home olmesartan for now -- Monitor BP  Peptic ulcer disease Currently not on medication outpatient. -- pepcid PRN    DVT prophylaxis:  apixaban (ELIQUIS) tablet 5 mg    Code Status: Full Code Family Communication: No family present at bedside  Disposition Plan:  Level of care: Stepdown Status is: Inpatient Remains inpatient appropriate because: IV Cardizem drip, PT/OT evaluation, echocardiogram pending    Consultants:  None  Procedures:  TTE: Pending  Antimicrobials:  None   Subjective: Patient seen examined bedside, resting calmly.  Remains in ED holding area.  Continues with mild dyspnea, improved.  Remains on IV Cardizem drip, heart rate now controlled but  remains irregular.  Discussed with RN, will attempt to wean off this morning.  Patient with no other spacer complaints, concerns or questions at this time.  Denies headache, no dizziness, no chest pain, no palpitations, no fever/chills/night sweats, no nausea/vomiting/diarrhea, no focal weakness, no  abdominal pain, no paresthesias.  No acute events overnight per nursing staff.  Objective: Vitals:   08/10/23 0830 08/10/23 0912 08/10/23 0915 08/10/23 0930  BP: 122/71  125/75 130/66  Pulse: 74  76 79  Resp: (!) 21  20   Temp:  97.7 F (36.5 C)    TempSrc:  Oral    SpO2: 98%  99% 96%  Weight:      Height:       No intake or output data in the 24 hours ending 08/10/23 0953 Filed Weights   08/09/23 0944  Weight: 81.6 kg    Examination:  Physical Exam: GEN: NAD, alert and oriented x 3, elderly/chronically ill appearance HEENT: NCAT, PERRL, EOMI, sclera clear, MMM PULM: Breath sounds slight diminished bilateral bases, no wheezing/crackles, normal respiratory effort without accessory muscle use, on room air at rest with SpO2 98%. CV: Irregularly irregular rhythm, normal rate w/o M/G/R GI: abd soft, NTND, + BS MSK: no peripheral edema, muscle strength globally intact 5/5 bilateral upper/lower extremities NEURO: CN II-XII intact, no focal deficits, sensation to light touch intact PSYCH: normal mood/affect Integumentary: No concerning rashes/lesions/wounds noted on exposed skin surfaces    Data Reviewed: I have personally reviewed following labs and imaging studies  CBC: Recent Labs  Lab 08/09/23 1007 08/10/23 0530  WBC 8.6 8.4  HGB 12.1* 11.3*  HCT 38.9* 36.1*  MCV 102.9* 104.3*  PLT 206 190   Basic Metabolic Panel: Recent Labs  Lab 08/09/23 1007 08/09/23 1700 08/10/23 0530  NA 142  --  142  K 4.5  --  4.8  CL 111  --  112*  CO2 22  --  21*  GLUCOSE 138*  --  121*  BUN 34*  --  35*  CREATININE 1.63*  --  1.72*  CALCIUM 8.7*  --  8.6*  MG  --  2.2  --    GFR: Estimated Creatinine Clearance: 33.8 mL/min (A) (by C-G formula based on SCr of 1.72 mg/dL (H)). Liver Function Tests: No results for input(s): "AST", "ALT", "ALKPHOS", "BILITOT", "PROT", "ALBUMIN" in the last 168 hours. No results for input(s): "LIPASE", "AMYLASE" in the last 168 hours. No results  for input(s): "AMMONIA" in the last 168 hours. Coagulation Profile: No results for input(s): "INR", "PROTIME" in the last 168 hours. Cardiac Enzymes: No results for input(s): "CKTOTAL", "CKMB", "CKMBINDEX", "TROPONINI" in the last 168 hours. BNP (last 3 results) No results for input(s): "PROBNP" in the last 8760 hours. HbA1C: No results for input(s): "HGBA1C" in the last 72 hours. CBG: No results for input(s): "GLUCAP" in the last 168 hours. Lipid Profile: No results for input(s): "CHOL", "HDL", "LDLCALC", "TRIG", "CHOLHDL", "LDLDIRECT" in the last 72 hours. Thyroid Function Tests: Recent Labs    08/09/23 1700  TSH 1.420   Anemia Panel: No results for input(s): "VITAMINB12", "FOLATE", "FERRITIN", "TIBC", "IRON", "RETICCTPCT" in the last 72 hours. Sepsis Labs: Recent Labs  Lab 08/09/23 1022 08/09/23 1335  LATICACIDVEN 1.5 1.3    Recent Results (from the past 240 hours)  Blood culture (routine x 2)     Status: None (Preliminary result)   Collection Time: 08/09/23 10:28 AM   Specimen: BLOOD  Result Value Ref Range Status   Specimen Description  BLOOD LEFT ANTECUBITAL  Final   Special Requests   Final    BOTTLES DRAWN AEROBIC AND ANAEROBIC Blood Culture results may not be optimal due to an inadequate volume of blood received in culture bottles Performed at [facility], [address], [place], [place] [address]    Culture PENDING  Incomplete   Report Status PENDING  Incomplete  Blood culture (routine x 2)     Status: None (Preliminary result)   Collection Time: 08/09/23 11:41 AM   Specimen: BLOOD  Result Value Ref Range Status   Specimen Description BLOOD RIGHT ANTECUBITAL  Final   Special Requests   Final    BOTTLES DRAWN AEROBIC AND ANAEROBIC Blood Culture results may not be optimal due to an inadequate volume of blood received in culture bottles Performed at [facility], [address], [place], [place] [address]    Culture PENDING  Incomplete   Report Status PENDING   Incomplete  Resp panel by RT-PCR (RSV, Flu A&B, Covid) Anterior Nasal Swab     Status: None   Collection Time: 08/09/23 12:37 PM   Specimen: Anterior Nasal Swab  Result Value Ref Range Status   SARS Coronavirus 2 by RT PCR NEGATIVE NEGATIVE Final    Comment: (NOTE) SARS-CoV-2 target nucleic acids are NOT DETECTED.  The SARS-CoV-2 RNA is generally detectable in upper respiratory specimens during the acute phase of infection. The lowest concentration of SARS-CoV-2 viral copies this assay can detect is 138 copies/mL. A negative result does not preclude SARS-Cov-2 infection and should not be used as the sole basis for treatment or other patient management decisions. A negative result may occur with  improper specimen collection/handling, submission of specimen other than nasopharyngeal swab, presence of viral mutation(s) within the areas targeted by this assay, and inadequate number of viral copies(<138 copies/mL). A negative result must be combined with clinical observations, patient history, and epidemiological information. The expected result is Negative.  Fact Sheet for Patients:  BloggerCourse.com  Fact Sheet for Healthcare Providers:  SeriousBroker.it  This test is no t yet approved or cleared by the [place] FDA and  has been authorized for detection and/or diagnosis of SARS-CoV-2 by FDA under an Emergency Use Authorization (EUA). This EUA will remain  in effect (meaning this test can be used) for the duration of the COVID-19 declaration under Section 564(b)(1) of the Act, 21 U.S.C.section 360bbb-3(b)(1), unless the authorization is terminated  or revoked sooner.       Influenza A by PCR NEGATIVE NEGATIVE Final   Influenza B by PCR NEGATIVE NEGATIVE Final    Comment: (NOTE) The Xpert Xpress SARS-CoV-2/FLU/RSV plus assay is intended as an aid in the diagnosis of influenza from Nasopharyngeal swab specimens and should  not be used as a sole basis for treatment. Nasal washings and aspirates are unacceptable for Xpert Xpress SARS-CoV-2/FLU/RSV testing.  Fact Sheet for Patients: BloggerCourse.com  Fact Sheet for Healthcare Providers: SeriousBroker.it  This test is not yet approved or cleared by the [place] FDA and has been authorized for detection and/or diagnosis of SARS-CoV-2 by FDA under an Emergency Use Authorization (EUA). This EUA will remain in effect (meaning this test can be used) for the duration of the COVID-19 declaration under Section 564(b)(1) of the Act, 21 U.S.C. section 360bbb-3(b)(1), unless the authorization is terminated or revoked.     Resp Syncytial Virus by PCR NEGATIVE NEGATIVE Final    Comment: (NOTE) Fact Sheet for Patients: BloggerCourse.com  Fact Sheet for Healthcare Providers: SeriousBroker.it  This  test is not yet approved or cleared by the [place] and has been authorized for detection and/or diagnosis of SARS-CoV-2 by FDA under an Emergency Use Authorization (EUA). This EUA will remain in effect (meaning this test can be used) for the duration of the COVID-19 declaration under Section 564(b)(1) of the Act, 21 U.S.C. section 360bbb-3(b)(1), unless the authorization is terminated or revoked.  Performed at [facility], [address], [place], [place] [address]          Radiology Studies: NM Pulmonary Perfusion Result Date: 08/09/2023 CLINICAL DATA:  Rule out pulmonary embolism. Complains of shortness of breath and cough. EXAM: NUCLEAR MEDICINE PERFUSION LUNG SCAN TECHNIQUE: Perfusion images were obtained in multiple projections after intravenous injection of radiopharmaceutical. Ventilation scans intentionally deferred if perfusion scan and chest x-ray adequate for interpretation during COVID 19 epidemic. RADIOPHARMACEUTICALS:  4.1 mCi Tc-13m MAA IV  COMPARISON:  Chest radiograph from 08/09/2023. FINDINGS: The chest radiograph from earlier today shows cardiac enlargement and signs suggestive of early CHF. No peripheral, no evidence segmental perfusion defects identified within the lungs identified to indicate acute pulmonary embolus. IMPRESSION: Negative for acute pulmonary embolus. Electronically Signed   By: Signa Kell M.D.   On: 08/09/2023 15:34   DG Chest Port 1 View Result Date: 08/09/2023 CLINICAL DATA:  Shortness of breath, cough, weakness EXAM: PORTABLE CHEST 1 VIEW COMPARISON:  CT chest 05/18/2023 FINDINGS: Moderate to prominent cardiomegaly. Apical lordotic projection. Emphysema. Faintly accentuated interstitial accentuation in the lungs with some mild indistinctness of pulmonary vasculature, query pulmonary venous hypertension. Subtle blunting of the left lateral costophrenic angle. Degenerative glenohumeral arthropathy bilaterally. Thoracic spondylosis. IMPRESSION: 1. Moderate to prominent cardiomegaly with faintly accentuated interstitial accentuation in the lungs and some mild indistinctness of pulmonary vasculature, query pulmonary venous hypertension. 2. Subtle blunting of the left lateral costophrenic angle, possibly a small effusion. 3. Emphysema. 4. Degenerative glenohumeral arthropathy bilaterally. Electronically Signed   By: Gaylyn Rong M.D.   On: 08/09/2023 10:33        Scheduled Meds:  apixaban  5 mg Oral BID   metoprolol succinate  50 mg Oral QHS   rosuvastatin  20 mg Oral QHS   Continuous Infusions:  diltiazem (CARDIZEM) infusion Stopped (08/10/23 0911)     LOS: 1 day    Time spent: 52 minutes spent on chart review, discussion with nursing staff, consultants, updating family and interview/physical exam; more than 50% of that time was spent in counseling and/or coordination of care.    Alvira Philips [place], DO [facility] Available via Epic secure chat 7am-7pm After these hours, please refer to  coverage provider listed on [email or web address] 08/10/2023, 9:53 AM

## 2023-08-10 NOTE — ED Notes (Signed)
Patient coughing with strong dry cough. Requests PRN cough medicine

## 2023-08-11 ENCOUNTER — Inpatient Hospital Stay (HOSPITAL_COMMUNITY): Payer: Medicare Other

## 2023-08-11 DIAGNOSIS — I4891 Unspecified atrial fibrillation: Secondary | ICD-10-CM | POA: Diagnosis not present

## 2023-08-11 LAB — MAGNESIUM: Magnesium: 2.1 mg/dL (ref 1.7–2.4)

## 2023-08-11 LAB — CBC
HCT: 38.5 % — ABNORMAL LOW (ref 39.0–52.0)
Hemoglobin: 11.8 g/dL — ABNORMAL LOW (ref 13.0–17.0)
MCH: 32.8 pg (ref 26.0–34.0)
MCHC: 30.6 g/dL (ref 30.0–36.0)
MCV: 106.9 fL — ABNORMAL HIGH (ref 80.0–100.0)
Platelets: 205 10*3/uL (ref 150–400)
RBC: 3.6 MIL/uL — ABNORMAL LOW (ref 4.22–5.81)
RDW: 14.1 % (ref 11.5–15.5)
WBC: 13.4 10*3/uL — ABNORMAL HIGH (ref 4.0–10.5)
nRBC: 0 % (ref 0.0–0.2)

## 2023-08-11 LAB — BRAIN NATRIURETIC PEPTIDE: B Natriuretic Peptide: 1136 pg/mL — ABNORMAL HIGH (ref 0.0–100.0)

## 2023-08-11 LAB — BASIC METABOLIC PANEL
Anion gap: 8 (ref 5–15)
BUN: 35 mg/dL — ABNORMAL HIGH (ref 8–23)
CO2: 21 mmol/L — ABNORMAL LOW (ref 22–32)
Calcium: 8.5 mg/dL — ABNORMAL LOW (ref 8.9–10.3)
Chloride: 112 mmol/L — ABNORMAL HIGH (ref 98–111)
Creatinine, Ser: 1.66 mg/dL — ABNORMAL HIGH (ref 0.61–1.24)
GFR, Estimated: 40 mL/min — ABNORMAL LOW (ref >60)
Glucose, Bld: 110 mg/dL — ABNORMAL HIGH (ref 70–99)
Potassium: 4.7 mmol/L (ref 3.5–5.1)
Sodium: 141 mmol/L (ref 135–145)

## 2023-08-11 LAB — PROCALCITONIN: Procalcitonin: 0.12 ng/mL

## 2023-08-11 LAB — GLUCOSE, CAPILLARY: Glucose-Capillary: 150 mg/dL — ABNORMAL HIGH (ref 70–99)

## 2023-08-11 LAB — MRSA NEXT GEN BY PCR, NASAL: MRSA by PCR Next Gen: NOT DETECTED

## 2023-08-11 MED ORDER — FUROSEMIDE 10 MG/ML IJ SOLN
60.0000 mg | Freq: Once | INTRAMUSCULAR | Status: AC
  Administered 2023-08-11: 60 mg via INTRAVENOUS
  Filled 2023-08-11: qty 6

## 2023-08-11 MED ORDER — IPRATROPIUM BROMIDE 0.02 % IN SOLN
0.5000 mg | Freq: Four times a day (QID) | RESPIRATORY_TRACT | Status: DC
  Administered 2023-08-11 – 2023-08-14 (×12): 0.5 mg via RESPIRATORY_TRACT
  Filled 2023-08-11 (×11): qty 2.5

## 2023-08-11 MED ORDER — AMIODARONE HCL IN DEXTROSE 360-4.14 MG/200ML-% IV SOLN
60.0000 mg/h | INTRAVENOUS | Status: AC
  Administered 2023-08-11: 60 mg/h via INTRAVENOUS
  Filled 2023-08-11: qty 200

## 2023-08-11 MED ORDER — FUROSEMIDE 10 MG/ML IJ SOLN
60.0000 mg | Freq: Every day | INTRAMUSCULAR | Status: DC
Start: 2023-08-12 — End: 2023-08-12

## 2023-08-11 MED ORDER — LEVALBUTEROL HCL 0.63 MG/3ML IN NEBU
0.6300 mg | INHALATION_SOLUTION | Freq: Four times a day (QID) | RESPIRATORY_TRACT | Status: DC | PRN
  Administered 2023-08-11 – 2023-08-16 (×2): 0.63 mg via RESPIRATORY_TRACT
  Filled 2023-08-11: qty 3

## 2023-08-11 MED ORDER — LEVALBUTEROL HCL 0.63 MG/3ML IN NEBU
0.6300 mg | INHALATION_SOLUTION | Freq: Four times a day (QID) | RESPIRATORY_TRACT | Status: DC
  Administered 2023-08-11 – 2023-08-14 (×12): 0.63 mg via RESPIRATORY_TRACT
  Filled 2023-08-11 (×11): qty 3

## 2023-08-11 MED ORDER — METOPROLOL SUCCINATE ER 50 MG PO TB24
100.0000 mg | ORAL_TABLET | Freq: Every day | ORAL | Status: DC
  Administered 2023-08-11 – 2023-08-17 (×7): 100 mg via ORAL
  Filled 2023-08-11 (×6): qty 2
  Filled 2023-08-11: qty 4

## 2023-08-11 MED ORDER — LEVALBUTEROL HCL 0.63 MG/3ML IN NEBU
INHALATION_SOLUTION | RESPIRATORY_TRACT | Status: AC
  Filled 2023-08-11: qty 3

## 2023-08-11 MED ORDER — IPRATROPIUM BROMIDE 0.02 % IN SOLN
RESPIRATORY_TRACT | Status: AC
  Filled 2023-08-11: qty 2.5

## 2023-08-11 MED ORDER — FUROSEMIDE 10 MG/ML IJ SOLN
40.0000 mg | Freq: Every day | INTRAMUSCULAR | Status: DC
  Administered 2023-08-11: 40 mg via INTRAVENOUS
  Filled 2023-08-11: qty 4

## 2023-08-11 MED ORDER — AMIODARONE LOAD VIA INFUSION
150.0000 mg | Freq: Once | INTRAVENOUS | Status: AC
  Administered 2023-08-11: 150 mg via INTRAVENOUS
  Filled 2023-08-11: qty 83.34

## 2023-08-11 MED ORDER — CHLORHEXIDINE GLUCONATE CLOTH 2 % EX PADS
6.0000 | MEDICATED_PAD | Freq: Every day | CUTANEOUS | Status: DC
  Administered 2023-08-11 – 2023-08-16 (×6): 6 via TOPICAL

## 2023-08-11 MED ORDER — SODIUM CHLORIDE 0.9 % IV SOLN
500.0000 mg | INTRAVENOUS | Status: DC
  Administered 2023-08-11: 500 mg via INTRAVENOUS
  Filled 2023-08-11: qty 5

## 2023-08-11 MED ORDER — SODIUM CHLORIDE 0.9 % IV SOLN
2.0000 g | INTRAVENOUS | Status: AC
  Administered 2023-08-11 – 2023-08-15 (×5): 2 g via INTRAVENOUS
  Filled 2023-08-11 (×5): qty 20

## 2023-08-11 MED ORDER — AMIODARONE HCL IN DEXTROSE 360-4.14 MG/200ML-% IV SOLN
30.0000 mg/h | INTRAVENOUS | Status: DC
  Administered 2023-08-11 – 2023-08-15 (×7): 30 mg/h via INTRAVENOUS
  Filled 2023-08-11 (×8): qty 200

## 2023-08-11 MED ORDER — DILTIAZEM HCL 25 MG/5ML IV SOLN
5.0000 mg | Freq: Once | INTRAVENOUS | Status: AC
  Administered 2023-08-11: 5 mg via INTRAVENOUS
  Filled 2023-08-11: qty 5

## 2023-08-11 NOTE — Progress Notes (Addendum)
 PROGRESS NOTE    Juan Arnold  FMW:[id number] DOB: 22-Sep-1934 DOA: 08/09/2023 PCP: Lenon Layman ORN, MD    Brief Narrative:   Juan Arnold is a 89 y.o. male with past medical history significant for HTN, history of DVT on Eliquis , CKD stage IIIb, CAD, peptic ulcer disease, history of colon cancer who presented to [facility] ED on 08/09/2023 with progressive shortness of breath over the last week.  Patient reports worse with exertion and associated with fatigue.  Also endorses worsening cough over the last 2 weeks.  Started on Eliquis  for DVT 2-3 months ago and reports compliance.  Remote history of tobacco use, currently inactive.  Denies chest pain, no palpitations, no fever/chills, no nausea/vomiting/diarrhea, no abdominal pain, no urinary symptoms.  In the ED, temperature 98.6 F, HR 112, RR 23, BP 118/90, SpO2 97% on room air.  WBC 8.6, hemoglobin 12.1, platelet count 206.  Sodium 142, potassium 4.5, chloride 111, CO2 22, glucose 138, BUN 34, creat 1.63.  BNP 1193.0.  High sensitive troponin 25>23.  Chest x-ray with small pleural effusion, emphysema.  Patient was given Cardizem  10 mg IV bolus followed by continuous infusion.  VQ scan negative for PE.  TRH consulted for admission for further evaluation management of new onset A-fib with RVR.  Assessment & Plan:   Paroxysmal atrial fibrillation with RVR, new diagnosis Patient dying with progressive weakness, dyspnea, fatigue.  Was noted to be in A-fib with RVR with heart rate 132 on EKG.  No previous diagnosis of A-fib.  Currently on Eliquis  outpatient for recent diagnosis of DVT.  TSH 1.420, within normal limits.  TTE with LVEF 40%, LV moderately decreased function with global hypokinesis, LV moderately dilated, mild LVH, biatrial enlargement, moderate MR, IVC dilated. -- Metoprolol  succinate increased to 100 mg p.o. daily -- Amiodarone  bolus followed by drip -- Eliquis  5 mg p.o. twice daily -- Continue monitor on  telemetry  Concern for developing hospital-acquired pneumonia -- Check CBC, procalcitonin; although remains afebrile -- Sputum culture, Legionella/strep pneumo antigen -- Azithromycin  500 mg IV every 24 hours x 5 days -- Ceftriaxone  2 g IV every 24 hours x 5 days  Acute on chronic systolic congestive heart failure, new diagnosis TTE with LVEF 40%, LV moderately decreased function with global hypokinesis, LV moderately dilated, mild LVH, biatrial enlargement, moderate MR, IVC dilated. -- BNP 8806>8863 -- Start Lasix  60 mg IV daily -- 1500 mL fluid restriction -- Strict I's and O's, daily weights  Elevated troponin Troponin elevated initially at 25 followed by 23, patient denies chest pain.  Etiology likely secondary to type II demand ischemia in the setting of A-fib with RVR as above. -- Continue monitor on telemetry  Dyspnea Cough Patient complaining of progressive shortness of breath, cough over the last several weeks.  Not hypoxic and not requiring oxygen therapy.  Etiology likely secondary to mild vascular congestion from A-fib with RVR.  COVID, influenza, RSV PCR negative.  BNP elevated 1193.  Chest x-ray with cardiomegaly, questionable left pleural effusion.  Nuclear medicine VQ scan negative for PE. -- Ambulatory O2 screen  History of DVT -- Eliquis  5 mg p.o. twice daily  CKD stage IIIb -- Cr 1.63>1.72>1.66 (baseline 1.50 05/18/2023), stable -- Avoid nephrotoxins, renally dose all medications -- Repeat BMP in a.m.  Nonobstructive CAD Underwent left heart catheterization 07/23/2021 with mid LM lesion 40% stenosis, proximal LAD-mid LAD lesion 30% stenosed, proximal RCA-1 lesion 20% stenosed, proximal RCA-2 lesion 20% stenosed, mid RCA lesion 30% stenosis, LVEF  55-65%. -- Crestor  20 mg p.o. daily  Essential hypertension Home regimen includes metoprolol  succinate 25 mg p.o. daily, olmesartan  40 mg p.o. daily. -- Metoprolol  succinate increased to 100 mg p.o. daily as above --  Hold home olmesartan  for now -- Monitor BP  Peptic ulcer disease Currently not on medication outpatient. -- pepcid  PRN    DVT prophylaxis:  apixaban  (ELIQUIS ) tablet 5 mg    Code Status: Full Code Family Communication: No family present at bedside  Disposition Plan:  Level of care: Telemetry Status is: Inpatient Remains inpatient appropriate because: IV diuresis, needs weaning from supplemental oxygen, starting antibiotics, amiodarone  drip, anticipate discharge home in 2-3 days    Consultants:  Cardiology  Procedures:  TTE:   Antimicrobials:  None   Subjective: Patient seen examined bedside, resting calmly.  Sitting on edge of bed.  Spouse present.  Continues with dyspnea, wheezing.  On 2 L nasal cannula, not dependent at baseline.  Discussed with patient and spouse echocardiogram findings with reduced LVEF which is new.  Also needs further up titration of metoprolol  given labile heart rate still in the low 100s.  Patient and spouse requesting change of PCP to the [place] area.  Patient with no other specific questions, concerns or complaints at this time.  Denies headache, no dizziness, no chest pain, no palpitations, no fever/chills/night sweats, no nausea/vomiting/diarrhea, no focal weakness, no abdominal pain, no paresthesias.  No acute events overnight per nursing staff.  Objective: Vitals:   08/10/23 1830 08/10/23 2118 08/11/23 0447 08/11/23 0613  BP: 109/83 115/77 (!) 120/95 113/80  Pulse: (!) 108 (!) 106  (!) 101  Resp: 16 17 (!) 22 18  Temp:  97.6 F (36.4 C) 97.8 F (36.6 C) (!) 97.5 F (36.4 C)  TempSrc:  Oral Oral Oral  SpO2: 96% 95% 98% 97%  Weight:   89.7 kg   Height:        Intake/Output Summary (Last 24 hours) at 08/11/2023 1135 Last data filed at 08/11/2023 0450 Gross per 24 hour  Intake 240 ml  Output 250 ml  Net -10 ml   Filed Weights   08/09/23 0944 08/11/23 0447  Weight: 81.6 kg 89.7 kg    Examination:  Physical Exam: GEN: NAD, alert  and oriented x 3, elderly/chronically ill appearance HEENT: NCAT, PERRL, EOMI, sclera clear, MMM PULM: Breath sounds slight diminished bilateral bases, + crackles, normal respiratory effort without accessory muscle use, on 2 L nasal cannula with SpO2 96% at rest CV: Irregularly irregular rhythm, slightly tachycardic, w/o M/G/R GI: abd soft, NTND, + BS MSK: no peripheral edema, muscle strength globally intact 5/5 bilateral upper/lower extremities NEURO: CN II-XII intact, no focal deficits, sensation to light touch intact PSYCH: normal mood/affect Integumentary: No concerning rashes/lesions/wounds noted on exposed skin surfaces    Data Reviewed: I have personally reviewed following labs and imaging studies  CBC: Recent Labs  Lab 08/09/23 1007 08/10/23 0530  WBC 8.6 8.4  HGB 12.1* 11.3*  HCT 38.9* 36.1*  MCV 102.9* 104.3*  PLT 206 190   Basic Metabolic Panel: Recent Labs  Lab 08/09/23 1007 08/09/23 1700 08/10/23 0530 08/11/23 0409  NA 142  --  142 141  K 4.5  --  4.8 4.7  CL 111  --  112* 112*  CO2 22  --  21* 21*  GLUCOSE 138*  --  121* 110*  BUN 34*  --  35* 35*  CREATININE 1.63*  --  1.72* 1.66*  CALCIUM  8.7*  --  8.6*  8.5*  MG  --  2.2  --  2.1   GFR: Estimated Creatinine Clearance: 35.1 mL/min (A) (by C-G formula based on SCr of 1.66 mg/dL (H)). Liver Function Tests: No results for input(s): AST, ALT, ALKPHOS, BILITOT, PROT, ALBUMIN  in the last 168 hours. No results for input(s): LIPASE, AMYLASE in the last 168 hours. No results for input(s): AMMONIA in the last 168 hours. Coagulation Profile: No results for input(s): INR, PROTIME in the last 168 hours. Cardiac Enzymes: No results for input(s): CKTOTAL, CKMB, CKMBINDEX, TROPONINI in the last 168 hours. BNP (last 3 results) No results for input(s): PROBNP in the last 8760 hours. HbA1C: No results for input(s): HGBA1C in the last 72 hours. CBG: No results for input(s):  GLUCAP in the last 168 hours. Lipid Profile: No results for input(s): CHOL, HDL, LDLCALC, TRIG, CHOLHDL, LDLDIRECT in the last 72 hours. Thyroid Function Tests: Recent Labs    08/09/23 1700  TSH 1.420   Anemia Panel: No results for input(s): VITAMINB12, FOLATE, FERRITIN, TIBC, IRON, RETICCTPCT in the last 72 hours. Sepsis Labs: Recent Labs  Lab 08/09/23 1022 08/09/23 1335  LATICACIDVEN 1.5 1.3    Recent Results (from the past 240 hours)  Blood culture (routine x 2)     Status: None (Preliminary result)   Collection Time: 08/09/23 10:28 AM   Specimen: BLOOD  Result Value Ref Range Status   Specimen Description BLOOD LEFT ANTECUBITAL  Final   Special Requests   Final    BOTTLES DRAWN AEROBIC AND ANAEROBIC Blood Culture results may not be optimal due to an inadequate volume of blood received in culture bottles   Culture   Final    NO GROWTH 2 DAYS Performed at [facility], [address], [place], [place] [address]    Report Status PENDING  Incomplete  Blood culture (routine x 2)     Status: None (Preliminary result)   Collection Time: 08/09/23 11:41 AM   Specimen: BLOOD  Result Value Ref Range Status   Specimen Description BLOOD RIGHT ANTECUBITAL  Final   Special Requests   Final    BOTTLES DRAWN AEROBIC AND ANAEROBIC Blood Culture results may not be optimal due to an inadequate volume of blood received in culture bottles   Culture   Final    NO GROWTH 2 DAYS Performed at [facility] [address] [place], [place] [address]    Report Status PENDING  Incomplete  Resp panel by RT-PCR (RSV, Flu A&B, Covid) Anterior Nasal Swab     Status: None   Collection Time: 08/09/23 12:37 PM   Specimen: Anterior Nasal Swab  Result Value Ref Range Status   SARS Coronavirus 2 by RT PCR NEGATIVE NEGATIVE Final    Comment: (NOTE) SARS-CoV-2 target nucleic acids are NOT DETECTED.  The SARS-CoV-2 RNA is generally detectable in upper respiratory specimens  during the acute phase of infection. The lowest concentration of SARS-CoV-2 viral copies this assay can detect is 138 copies/mL. A negative result does not preclude SARS-Cov-2 infection and should not be used as the sole basis for treatment or other patient management decisions. A negative result may occur with  improper specimen collection/handling, submission of specimen other than nasopharyngeal swab, presence of viral mutation(s) within the areas targeted by this assay, and inadequate number of viral copies(<138 copies/mL). A negative result must be combined with clinical observations, patient history, and epidemiological information. The expected result is Negative.  Fact Sheet for Patients:  bloggercourse.com  Fact Sheet for Healthcare Providers:  seriousbroker.it  This test is no t yet approved or cleared by the [place]  FDA and  has been authorized for detection and/or diagnosis of SARS-CoV-2 by FDA under an Emergency Use Authorization (EUA). This EUA will remain  in effect (meaning this test can be used) for the duration of the COVID-19 declaration under Section 564(b)(1) of the Act, 21 U.S.C.section 360bbb-3(b)(1), unless the authorization is terminated  or revoked sooner.       Influenza A by PCR NEGATIVE NEGATIVE Final   Influenza B by PCR NEGATIVE NEGATIVE Final    Comment: (NOTE) The Xpert Xpress SARS-CoV-2/FLU/RSV plus assay is intended as an aid in the diagnosis of influenza from Nasopharyngeal swab specimens and should not be used as a sole basis for treatment. Nasal washings and aspirates are unacceptable for Xpert Xpress SARS-CoV-2/FLU/RSV testing.  Fact Sheet for Patients: bloggercourse.com  Fact Sheet for Healthcare Providers: seriousbroker.it  This test is not yet approved or cleared by the [place]  FDA and has been authorized for detection  and/or diagnosis of SARS-CoV-2 by FDA under an Emergency Use Authorization (EUA). This EUA will remain in effect (meaning this test can be used) for the duration of the COVID-19 declaration under Section 564(b)(1) of the Act, 21 U.S.C. section 360bbb-3(b)(1), unless the authorization is terminated or revoked.     Resp Syncytial Virus by PCR NEGATIVE NEGATIVE Final    Comment: (NOTE) Fact Sheet for Patients: bloggercourse.com  Fact Sheet for Healthcare Providers: seriousbroker.it  This test is not yet approved or cleared by the [place]  FDA and has been authorized for detection and/or diagnosis of SARS-CoV-2 by FDA under an Emergency Use Authorization (EUA). This EUA will remain in effect (meaning this test can be used) for the duration of the COVID-19 declaration under Section 564(b)(1) of the Act, 21 U.S.C. section 360bbb-3(b)(1), unless the authorization is terminated or revoked.  Performed at [facility], [address] [place], [place] [address]          Radiology Studies: ECHOCARDIOGRAM COMPLETE Result Date: 08/10/2023    ECHOCARDIOGRAM REPORT   Patient Name:   ARIO MCDIARMID Date of Exam: 08/10/2023 Medical Rec #:  [id number]      Height:       72.0 in Accession #:    [id number]     Weight:       180.0 lb Date of Birth:  11-22-1934     BSA:          2.037 m Patient Age:    88 years       BP:           124/79 mmHg Patient Gender: M              HR:           81 bpm. Exam Location:  [facility] Procedure: 2D Echo, Cardiac Doppler, Color Doppler and Intracardiac            Opacification Agent Indications:    Atrial Fibrillation I48.91  History:        Patient has no prior history of Echocardiogram examinations.                 Arrythmias:RBBB and Atrial Fibrillation; Risk                 Factors:Hypertension.  Sonographer:    Lanell Maduro Referring Phys: [id number] EJIROGHENE E EMOKPAE IMPRESSIONS  1. Left ventricular ejection  fraction, by estimation, is 40%. The left ventricle has moderately  decreased function. The left ventricle demonstrates global hypokinesis. The left ventricular internal cavity size was moderately dilated. There is mild left ventricular hypertrophy. Left ventricular diastolic parameters are indeterminate.  2. Right ventricular systolic function is low normal. The right ventricular size is normal.  3. Left atrial size was severely dilated.  4. Right atrial size was severely dilated.  5. Moderate mitral valve regurgitation.  6. Tricuspid valve regurgitation is severe.  7. The aortic valve is tricuspid. Aortic valve regurgitation is not visualized. Aortic valve sclerosis is present, with no evidence of aortic valve stenosis.  8. The inferior vena cava is dilated in size with <50% respiratory variability, suggesting right atrial pressure of 15 mmHg. FINDINGS  Left Ventricle: Left ventricular ejection fraction, by estimation, is 40%. The left ventricle has moderately decreased function. The left ventricle demonstrates global hypokinesis. The left ventricular internal cavity size was moderately dilated. There is mild left ventricular hypertrophy. Left ventricular diastolic parameters are indeterminate. Right Ventricle: The right ventricular size is normal. Right vetricular wall thickness was not assessed. Right ventricular systolic function is low normal. Left Atrium: Left atrial size was severely dilated. Right Atrium: Right atrial size was severely dilated. Pericardium: There is no evidence of pericardial effusion. Mitral Valve: There is mild thickening of the mitral valve leaflet(s). Moderate mitral valve regurgitation. Tricuspid Valve: The tricuspid valve is normal in structure. Tricuspid valve regurgitation is severe. Aortic Valve: The aortic valve is tricuspid. Aortic valve regurgitation is not visualized. Aortic valve sclerosis is present, with no evidence of aortic valve stenosis. Pulmonic Valve: The pulmonic valve  was not well visualized. Pulmonic valve regurgitation is not visualized. Aorta: The aortic root and ascending aorta are structurally normal, with no evidence of dilitation. Venous: The inferior vena cava is dilated in size with less than 50% respiratory variability, suggesting right atrial pressure of 15 mmHg. IAS/Shunts: No atrial level shunt detected by color flow Doppler.  LEFT VENTRICLE PLAX 2D LVIDd:         6.00 cm      Diastology LVIDs:         2.65 cm      LV e' medial:   7.62 cm/s LV PW:         1.20 cm      LV E/e' medial: 10.9 LV IVS:        1.20 cm LVOT diam:     2.50 cm LV SV:         57 LV SV Index:   28 LVOT Area:     4.91 cm  LV Volumes (MOD) LV vol d, MOD A2C: 119.0 ml LV vol d, MOD A4C: 108.0 ml LV vol s, MOD A2C: 42.6 ml LV vol s, MOD A4C: 48.4 ml LV SV MOD A2C:     76.4 ml LV SV MOD A4C:     108.0 ml LV SV MOD BP:      68.8 ml RIGHT VENTRICLE             IVC RV Basal diam:  4.90 cm     IVC diam: 3.10 cm RV Mid diam:    3.00 cm RV S prime:     10.30 cm/s TAPSE (M-mode): 2.0 cm LEFT ATRIUM              Index        RIGHT ATRIUM           Index LA diam:        5.20 cm  2.55  cm/m   RA Area:     43.40 cm LA Vol (A2C):   110.0 ml 53.99 ml/m  RA Volume:   188.00 ml 92.28 ml/m LA Vol (A4C):   124.0 ml 60.87 ml/m LA Biplane Vol: 130.0 ml 63.81 ml/m  AORTIC VALVE LVOT Vmax:   59.60 cm/s LVOT Vmean:  41.867 cm/s LVOT VTI:    0.116 m  AORTA Ao Root diam: 3.70 cm Ao Asc diam:  3.50 cm MITRAL VALVE               TRICUSPID VALVE MV Area (PHT): 3.68 cm    TR Peak grad:   18.7 mmHg MV Decel Time: 206 msec    TR Vmax:        216.00 cm/s MR Peak grad: 88.0 mmHg MR Mean grad: 52.0 mmHg    SHUNTS MR Vmax:      469.00 cm/s  Systemic VTI:  0.12 m MR Vmean:     336.0 cm/s   Systemic Diam: 2.50 cm MV E velocity: 83.40 cm/s Vina Gull MD Electronically signed by Vina Gull MD Signature Date/Time: 08/10/2023/8:08:10 PM    Final    NM Pulmonary Perfusion Result Date: 08/09/2023 CLINICAL DATA:  Rule out pulmonary  embolism. Complains of shortness of breath and cough. EXAM: NUCLEAR MEDICINE PERFUSION LUNG SCAN TECHNIQUE: Perfusion images were obtained in multiple projections after intravenous injection of radiopharmaceutical. Ventilation scans intentionally deferred if perfusion scan and chest x-ray adequate for interpretation during COVID 19 epidemic. RADIOPHARMACEUTICALS:  4.1 mCi Tc-32m MAA IV COMPARISON:  Chest radiograph from 08/09/2023. FINDINGS: The chest radiograph from earlier today shows cardiac enlargement and signs suggestive of early CHF. No peripheral, no evidence segmental perfusion defects identified within the lungs identified to indicate acute pulmonary embolus. IMPRESSION: Negative for acute pulmonary embolus. Electronically Signed   By: Waddell Calk M.D.   On: 08/09/2023 15:34        Scheduled Meds:  apixaban   5 mg Oral BID   furosemide   40 mg Intravenous Daily   metoprolol  succinate  100 mg Oral Daily   rosuvastatin   20 mg Oral QHS   Continuous Infusions:     LOS: 2 days    Time spent: 52 minutes spent on chart review, discussion with nursing staff, consultants, updating family and interview/physical exam; more than 50% of that time was spent in counseling and/or coordination of care.    Camellia PARAS Kingsten Enfield, DO [facility] Available via Epic secure chat 7am-7pm After these hours, please refer to coverage provider listed on [email or web address] 08/11/2023, 11:35 AM

## 2023-08-11 NOTE — Plan of Care (Signed)
  Problem: Clinical Measurements: Goal: Respiratory complications will improve Outcome: Progressing Goal: Cardiovascular complication will be avoided Outcome: Progressing   Problem: Clinical Measurements: Goal: Cardiovascular complication will be avoided Outcome: Progressing

## 2023-08-11 NOTE — Progress Notes (Addendum)
   08/11/23 0447  Assess: MEWS Score  Temp 97.8 F (36.6 C)  BP (!) 120/95  MAP (mmHg) 105  Resp (!) 22  Level of Consciousness Alert  SpO2 98 %  O2 Device Room Air  Assess: MEWS Score  MEWS Temp 0  MEWS Systolic 0  MEWS Pulse 1  MEWS RR 1  MEWS LOC 0  MEWS Score 2  MEWS Score Color Yellow  Assess: if the MEWS score is Yellow or Red  Were vital signs accurate and taken at a resting state? Yes  Does the patient meet 2 or more of the SIRS criteria? No  MEWS guidelines implemented  Yes, yellow  Treat  MEWS Interventions Considered administering scheduled or prn medications/treatments as ordered  Take Vital Signs  Increase Vital Sign Frequency  Yellow: Q2hr x1, continue Q4hrs until patient remains green for 12hrs  Escalate  MEWS: Escalate Yellow: Discuss with charge nurse and consider notifying provider and/or RRT  Notify: Charge Nurse/RN  Name of Charge Nurse/RN Notified Alli Aquarius Latouche,RN/[facility]  Provider Notification  Provider Name/Title Dr. Madison Peaches  Date Provider Notified 08/11/23  Time Provider Notified [phone]  Method of Notification Page (secure chat)  Notification Reason Other (Comment) (yelloe mews)  Provider response Other (Comment) (awaiting orders)  Assess: SIRS CRITERIA  SIRS Temperature  0  SIRS Respirations  1  SIRS Pulse 1  SIRS WBC 0  SIRS Score Sum  2   Came to patients bedside due to increased HR on tele monitor. EKG obtained which showed AFIB with RVR, new order obtained for IV push of Cardizem , per [facility]

## 2023-08-11 NOTE — TOC Initial Note (Signed)
 Transition of Care [facility]) - Initial/Assessment Note    Patient Details  Name: Juan Arnold MRN: [id number] Date of Birth: 08-Nov-1934  Transition of [facility]) CM/SW Contact:    Rollo Petri, LCSW Phone Number: 08/11/2023, 10:41 AM  Clinical Narrative:                  Pt admitted from home. Received consult stating pt/wife would like PCP in [place] area. PT eval completed stating that pt does not need any follow up at dc.  Met with pt and his wife at bedside. Pt is independent in ADLs at home. He does not use any DME.   Offered list of local PCP offices and/or referral to new PCP. Pt's wife accepted the list and stated she would work on arranging appointment.  TOC will follow and assist if needs arise.   Expected Discharge Plan: Home/Self Care Barriers to Discharge: Continued Medical Work up   Patient Goals and CMS Choice Patient states their goals for this hospitalization and ongoing recovery are:: return home          Expected Discharge Plan and Services In-house Referral: Clinical Social Work     Living arrangements for the past 2 months: Single Family Home                                      Prior Living Arrangements/Services Living arrangements for the past 2 months: Single Family Home Lives with:: Spouse Patient language and need for interpreter reviewed:: Yes Do you feel safe going back to the place where you live?: Yes      Need for Family Participation in Patient Care: No (Comment)     Criminal Activity/Legal Involvement Pertinent to Current Situation/Hospitalization: No - Comment as needed  Activities of Daily Living   ADL Screening (condition at time of admission) Independently performs ADLs?: Yes (appropriate for developmental age) Is the patient deaf or have difficulty hearing?: No Does the patient have difficulty seeing, even when wearing glasses/contacts?: No Does the patient have difficulty concentrating, remembering, or making  decisions?: No  Permission Sought/Granted                  Emotional Assessment Appearance:: Appears younger than stated age Attitude/Demeanor/Rapport: Engaged Affect (typically observed): Pleasant Orientation: : Oriented to Self, Oriented to Place, Oriented to  Time, Oriented to Situation Alcohol / Substance Use: Not Applicable Psych Involvement: No (comment)  Admission diagnosis:  Atrial fibrillation with rapid ventricular response (HCC) [I48.91] Atrial fibrillation with RVR (HCC) [I48.91] Patient Active Problem List   Diagnosis Date Noted   Atrial fibrillation with rapid ventricular response (HCC) 08/10/2023   Atrial fibrillation with RVR (HCC) 08/09/2023   DVT (deep venous thrombosis) (HCC) 08/09/2023   Colon cancer (HCC) 08/09/2023   CKD (chronic kidney disease) stage 3, GFR 30-59 ml/min (HCC) 08/09/2023   HTN (hypertension) 08/09/2023   RBBB 08/09/2023   PCP:  Lenon Layman ORN, MD Pharmacy:   [facility] [phone] - [place], [place] - [address] AT [facility] [address] [place] [address] Phone: [phone] Fax: [phone]     Social Drivers of Health (SDOH) Social History: SDOH Screenings   Food Insecurity: No Food Insecurity (08/09/2023)  Housing: Low Risk  (08/09/2023)  Transportation Needs: No Transportation Needs (08/09/2023)  Utilities: Not At Risk (08/09/2023)  Financial Resource Strain: Low Risk  (03/25/2023)  Received from [facility]  Social Connections: Moderately Integrated (08/09/2023)  Tobacco Use: Medium Risk (08/09/2023)   SDOH Interventions:     Readmission Risk Interventions     No data to display

## 2023-08-11 NOTE — Progress Notes (Signed)
 OT Cancellation Note  Patient Details Name: Juan Arnold MRN: [id number] DOB: [birth year]   Cancelled Treatment:    Reason Eval/Treat Not Completed: OT screened, no needs identified, will sign off. Pt reportedly ambulating well. Nursing reported the pt was not in need of a therapy evaluation given current function. Pt will be removed form the OT list.   JAYSON PERSON OT, MOT   Jayson Person 08/11/2023, 10:00 AM

## 2023-08-11 NOTE — Progress Notes (Signed)
   08/11/23 1422  Assess: MEWS Score  Temp 98.6 F (37 C)  BP (!) 144/94  Pulse Rate (!) 140  Resp (!) 22  Level of Consciousness Alert  SpO2 99 %  O2 Device Nasal Cannula  Patient Activity (if Appropriate) In bed  O2 Flow Rate (L/min) 2 L/min  Assess: MEWS Score  MEWS Temp 0  MEWS Systolic 0  MEWS Pulse 3  MEWS RR 1  MEWS LOC 0  MEWS Score 4  MEWS Score Color Red  Assess: if the MEWS score is Yellow or Red  Were vital signs accurate and taken at a resting state? Yes  Does the patient meet 2 or more of the SIRS criteria? Yes  Does the patient have a confirmed or suspected source of infection? No  MEWS guidelines implemented  Yes, red  Treat  MEWS Interventions Considered administering scheduled or prn medications/treatments as ordered  Take Vital Signs  Increase Vital Sign Frequency  Red: Q1hr x2, continue Q4hrs until patient remains green for 12hrs  Escalate  MEWS: Escalate Red: Discuss with charge nurse and notify provider. Consider notifying RRT. If remains red for 2 hours consider need for higher level of care  Notify: Charge Nurse/RN  Name of Charge Nurse/RN Notified ronal rather, RN  Provider Notification  Provider Name/Title dr austria  Date Provider Notified 08/11/23  Time Provider Notified 1429  Method of Notification  (rapid response called)  Notification Reason Change in status (HR sustaining in 140s)  Provider response See new orders (transfer to ICU/SD for amio drip)  Date of Provider Response 08/11/23  Time of Provider Response 1422  Notify: Rapid Response  Name of Rapid Response RN Notified warren benders, RN  Date Rapid Response Notified 08/11/23  Time Rapid Response Notified 1422  Assess: SIRS CRITERIA  SIRS Temperature  0  SIRS Respirations  1  SIRS Pulse 1  SIRS WBC 0  SIRS Score Sum  2

## 2023-08-11 NOTE — Progress Notes (Signed)
 PT Cancellation Note  Patient Details Name: ZEPPELIN COMMISSO MRN: [id number] DOB: [birth year]   Cancelled Treatment:    Reason Eval/Treat Not Completed: PT screened, no needs identified, will sign off. Patient walking independently in room.  12:10 PM, 08/11/23 Lynwood Music, MPT Physical Therapist with [facility] 336 [phone] office [phone] mobile phone

## 2023-08-11 NOTE — Plan of Care (Signed)
Pt HR has been stabilized after receiving Cardizem. Pts cough has decreased significantly, enabling the pt to fall asleep. Will continue to monitor pts vitals and person for visible changes and discomfort.

## 2023-08-12 ENCOUNTER — Inpatient Hospital Stay (HOSPITAL_COMMUNITY): Payer: Medicare Other

## 2023-08-12 DIAGNOSIS — I4891 Unspecified atrial fibrillation: Secondary | ICD-10-CM | POA: Diagnosis not present

## 2023-08-12 LAB — BRAIN NATRIURETIC PEPTIDE: B Natriuretic Peptide: 1172 pg/mL — ABNORMAL HIGH (ref 0.0–100.0)

## 2023-08-12 LAB — BASIC METABOLIC PANEL
Anion gap: 11 (ref 5–15)
BUN: 38 mg/dL — ABNORMAL HIGH (ref 8–23)
CO2: 21 mmol/L — ABNORMAL LOW (ref 22–32)
Calcium: 8.3 mg/dL — ABNORMAL LOW (ref 8.9–10.3)
Chloride: 107 mmol/L (ref 98–111)
Creatinine, Ser: 2.1 mg/dL — ABNORMAL HIGH (ref 0.61–1.24)
GFR, Estimated: 30 mL/min — ABNORMAL LOW (ref >60)
Glucose, Bld: 129 mg/dL — ABNORMAL HIGH (ref 70–99)
Potassium: 4.1 mmol/L (ref 3.5–5.1)
Sodium: 139 mmol/L (ref 135–145)

## 2023-08-12 LAB — CBC
HCT: 36 % — ABNORMAL LOW (ref 39.0–52.0)
Hemoglobin: 11 g/dL — ABNORMAL LOW (ref 13.0–17.0)
MCH: 32.2 pg (ref 26.0–34.0)
MCHC: 30.6 g/dL (ref 30.0–36.0)
MCV: 105.3 fL — ABNORMAL HIGH (ref 80.0–100.0)
Platelets: 171 10*3/uL (ref 150–400)
RBC: 3.42 MIL/uL — ABNORMAL LOW (ref 4.22–5.81)
RDW: 14 % (ref 11.5–15.5)
WBC: 9.8 10*3/uL (ref 4.0–10.5)
nRBC: 0 % (ref 0.0–0.2)

## 2023-08-12 LAB — STREP PNEUMONIAE URINARY ANTIGEN: Strep Pneumo Urinary Antigen: NEGATIVE

## 2023-08-12 LAB — PROCALCITONIN: Procalcitonin: 0.11 ng/mL

## 2023-08-12 LAB — MAGNESIUM: Magnesium: 2.1 mg/dL (ref 1.7–2.4)

## 2023-08-12 MED ORDER — DOXYCYCLINE HYCLATE 100 MG PO TABS
100.0000 mg | ORAL_TABLET | Freq: Two times a day (BID) | ORAL | Status: AC
  Administered 2023-08-12 – 2023-08-15 (×7): 100 mg via ORAL
  Filled 2023-08-12 (×7): qty 1

## 2023-08-12 MED ORDER — FUROSEMIDE 10 MG/ML IJ SOLN
40.0000 mg | Freq: Every day | INTRAMUSCULAR | Status: DC
  Administered 2023-08-12: 40 mg via INTRAVENOUS
  Filled 2023-08-12: qty 4

## 2023-08-12 NOTE — Plan of Care (Signed)
  Problem: Clinical Measurements: Goal: Respiratory complications will improve Outcome: Progressing Goal: Cardiovascular complication will be avoided Outcome: Progressing   Problem: Pain Managment: Goal: General experience of comfort will improve and/or be controlled Outcome: Progressing   Problem: Activity: Goal: Ability to tolerate increased activity will improve Outcome: Progressing   Problem: Clinical Measurements: Goal: Ability to maintain a body temperature in the normal range will improve Outcome: Progressing   Problem: Respiratory: Goal: Ability to maintain adequate ventilation will improve Outcome: Progressing Goal: Ability to maintain a clear airway will improve Outcome: Progressing

## 2023-08-12 NOTE — Progress Notes (Signed)
 PROGRESS NOTE    Juan Arnold  FMW:[id number] DOB: [birth year] DOA: 08/09/2023 PCP: Lenon Layman ORN, MD    Brief Narrative:   Juan Arnold is a 89 y.o. male with past medical history significant for HTN, history of DVT on Eliquis , CKD stage IIIb, CAD, peptic ulcer disease, history of colon cancer who presented to [facility] ED on 08/09/2023 with progressive shortness of breath over the last week.  Patient reports worse with exertion and associated with fatigue.  Also endorses worsening cough over the last 2 weeks.  Started on Eliquis  for DVT 2-3 months ago and reports compliance.  Remote history of tobacco use, currently inactive.  Denies chest pain, no palpitations, no fever/chills, no nausea/vomiting/diarrhea, no abdominal pain, no urinary symptoms.  In the ED, temperature 98.6 F, HR 112, RR 23, BP 118/90, SpO2 97% on room air.  WBC 8.6, hemoglobin 12.1, platelet count 206.  Sodium 142, potassium 4.5, chloride 111, CO2 22, glucose 138, BUN 34, creat 1.63.  BNP 1193.0.  High sensitive troponin 25>23.  Chest x-ray with small pleural effusion, emphysema.  Patient was given Cardizem  10 mg IV bolus followed by continuous infusion.  VQ scan negative for PE.  TRH consulted for admission for further evaluation management of new onset A-fib with RVR.  Assessment & Plan:   Paroxysmal atrial fibrillation with RVR, new diagnosis Patient dying with progressive weakness, dyspnea, fatigue.  Was noted to be in A-fib with RVR with heart rate 132 on EKG.  No previous diagnosis of A-fib.  Currently on Eliquis  outpatient for recent diagnosis of DVT.  TSH 1.420, within normal limits.  TTE with LVEF 40%, LV moderately decreased function with global hypokinesis, LV moderately dilated, mild LVH, biatrial enlargement, moderate MR, IVC dilated. -- Metoprolol  succinate increased to 100 mg p.o. daily -- Amiodarone  bolus followed by drip -- Eliquis  5 mg p.o. twice daily -- Continue monitor on  telemetry  Concern for developing hospital-acquired pneumonia -- Sputum culture, Legionella/strep pneumo antigen -- Azithromycin  500 mg IV every 24 hours x 5 days -- Ceftriaxone  2 g IV every 24 hours x 5 days  Acute on chronic systolic congestive heart failure, new diagnosis TTE with LVEF 40%, LV moderately decreased function with global hypokinesis, LV moderately dilated, mild LVH, biatrial enlargement, moderate MR, IVC dilated. -- BNP 1193>1136>1172.0 -- Lasix  60 mg IV daily -- 1500 mL fluid restriction -- Strict I's and O's, daily weights  Elevated troponin Troponin elevated initially at 25 followed by 23, patient denies chest pain.  Etiology likely secondary to type II demand ischemia in the setting of A-fib with RVR as above. -- Continue monitor on telemetry  Dyspnea Cough Patient complaining of progressive shortness of breath, cough over the last several weeks.  Not hypoxic and not requiring oxygen therapy.  Etiology likely secondary to mild vascular congestion from A-fib with RVR.  COVID, influenza, RSV PCR negative.  BNP elevated 1193.  Chest x-ray with cardiomegaly, questionable left pleural effusion.  Nuclear medicine VQ scan negative for PE.  History of DVT -- Eliquis  5 mg p.o. twice daily  AKI on CKD stage IIIb -- Cr 1.63>1.72>1.66>2.10 (baseline 1.50 05/18/2023) -- Avoid nephrotoxins, renally dose all medications -- Repeat BMP in a.m.  Nonobstructive CAD Underwent left heart catheterization 07/23/2021 with mid LM lesion 40% stenosis, proximal LAD-mid LAD lesion 30% stenosed, proximal RCA-1 lesion 20% stenosed, proximal RCA-2 lesion 20% stenosed, mid RCA lesion 30% stenosis, LVEF 55-65%. -- Crestor  20 mg p.o. daily  Essential hypertension Home  regimen includes metoprolol  succinate 25 mg p.o. daily, olmesartan  40 mg p.o. daily. -- Metoprolol  succinate increased to 100 mg p.o. daily as above -- Hold home olmesartan  for now -- Monitor BP  Peptic ulcer  disease Currently not on medication outpatient. -- pepcid  PRN    DVT prophylaxis:  apixaban  (ELIQUIS ) tablet 5 mg    Code Status: Full Code Family Communication: No family present at bedside  Disposition Plan:  Level of care: ICU Status is: Inpatient Remains inpatient appropriate because: IV diuresis, needs weaning from supplemental oxygen, starting antibiotics, amiodarone  drip    Consultants:  Cardiology  Procedures:  TTE:   Antimicrobials:  None   Subjective: Patient seen examined bedside, resting calmly.  Sitting on edge of bed.  Spouse present.  Patient with decompensation yesterday, started on antibiotics with slightly elevated WBC count and concerning for right sided lower lobe developing pneumonia on imaging.  This morning patient reports his breathing is improved, continues with good urine output; continues with dyspnea.  Patient with no other specific questions, concerns or complaints at this time.  Denies headache, no dizziness, no chest pain, no palpitations, no fever/chills/night sweats, no nausea/vomiting/diarrhea, no focal weakness, no abdominal pain, no paresthesias.  No acute events overnight per nursing staff.  Objective: Vitals:   08/12/23 0900 08/12/23 1000 08/12/23 1100 08/12/23 1110  BP: 101/62 122/68 112/69   Pulse: (!) 121 (!) 108 99 (!) 105  Resp: (!) 26 (!) 21 18 (!) 25  Temp:    98.3 F (36.8 C)  TempSrc:    Oral  SpO2: 100% 100% 100% 100%  Weight:      Height:        Intake/Output Summary (Last 24 hours) at 08/12/2023 1306 Last data filed at 08/12/2023 1204 Gross per 24 hour  Intake 1723.88 ml  Output 1725 ml  Net -1.12 ml   Filed Weights   08/09/23 0944 08/11/23 0447 08/11/23 1457  Weight: 81.6 kg 89.7 kg 89.5 kg    Examination:  Physical Exam: GEN: NAD, alert and oriented x 3, elderly/chronically ill appearance HEENT: NCAT, PERRL, EOMI, sclera clear, MMM PULM: Breath sounds slight diminished bilateral bases, + crackles, normal  respiratory effort without accessory muscle use, on 3 L nasal cannula with SpO2 100% at rest CV: Irregularly irregular rhythm, slightly tachycardic, w/o M/G/R GI: abd soft, NTND, + BS MSK: no peripheral edema, muscle strength globally intact 5/5 bilateral upper/lower extremities NEURO: CN II-XII intact, no focal deficits, sensation to light touch intact PSYCH: normal mood/affect Integumentary: No concerning rashes/lesions/wounds noted on exposed skin surfaces    Data Reviewed: I have personally reviewed following labs and imaging studies  CBC: Recent Labs  Lab 08/09/23 1007 08/10/23 0530 08/11/23 1752 08/12/23 0507  WBC 8.6 8.4 13.4* 9.8  HGB 12.1* 11.3* 11.8* 11.0*  HCT 38.9* 36.1* 38.5* 36.0*  MCV 102.9* 104.3* 106.9* 105.3*  PLT 206 190 205 171   Basic Metabolic Panel: Recent Labs  Lab 08/09/23 1007 08/09/23 1700 08/10/23 0530 08/11/23 0409 08/12/23 0507  NA 142  --  142 141 139  K 4.5  --  4.8 4.7 4.1  CL 111  --  112* 112* 107  CO2 22  --  21* 21* 21*  GLUCOSE 138*  --  121* 110* 129*  BUN 34*  --  35* 35* 38*  CREATININE 1.63*  --  1.72* 1.66* 2.10*  CALCIUM  8.7*  --  8.6* 8.5* 8.3*  MG  --  2.2  --  2.1 2.1  GFR: Estimated Creatinine Clearance: 27.7 mL/min (A) (by C-G formula based on SCr of 2.1 mg/dL (H)). Liver Function Tests: No results for input(s): AST, ALT, ALKPHOS, BILITOT, PROT, ALBUMIN  in the last 168 hours. No results for input(s): LIPASE, AMYLASE in the last 168 hours. No results for input(s): AMMONIA in the last 168 hours. Coagulation Profile: No results for input(s): INR, PROTIME in the last 168 hours. Cardiac Enzymes: No results for input(s): CKTOTAL, CKMB, CKMBINDEX, TROPONINI in the last 168 hours. BNP (last 3 results) No results for input(s): PROBNP in the last 8760 hours. HbA1C: No results for input(s): HGBA1C in the last 72 hours. CBG: Recent Labs  Lab 08/11/23 1425  GLUCAP 150*   Lipid  Profile: No results for input(s): CHOL, HDL, LDLCALC, TRIG, CHOLHDL, LDLDIRECT in the last 72 hours. Thyroid Function Tests: Recent Labs    08/09/23 1700  TSH 1.420   Anemia Panel: No results for input(s): VITAMINB12, FOLATE, FERRITIN, TIBC, IRON, RETICCTPCT in the last 72 hours. Sepsis Labs: Recent Labs  Lab 08/09/23 1022 08/09/23 1335 08/11/23 1752 08/12/23 0507  PROCALCITON  --   --  0.12 0.11  LATICACIDVEN 1.5 1.3  --   --     Recent Results (from the past 240 hours)  Blood culture (routine x 2)     Status: None (Preliminary result)   Collection Time: 08/09/23 10:28 AM   Specimen: BLOOD  Result Value Ref Range Status   Specimen Description BLOOD LEFT ANTECUBITAL  Final   Special Requests   Final    BOTTLES DRAWN AEROBIC AND ANAEROBIC Blood Culture results may not be optimal due to an inadequate volume of blood received in culture bottles   Culture   Final    NO GROWTH 3 DAYS Performed at [facility], [address], [place], [place] [address]    Report Status PENDING  Incomplete  Blood culture (routine x 2)     Status: None (Preliminary result)   Collection Time: 08/09/23 11:41 AM   Specimen: BLOOD  Result Value Ref Range Status   Specimen Description BLOOD RIGHT ANTECUBITAL  Final   Special Requests   Final    BOTTLES DRAWN AEROBIC AND ANAEROBIC Blood Culture results may not be optimal due to an inadequate volume of blood received in culture bottles   Culture   Final    NO GROWTH 3 DAYS Performed at [facility], [address] [place], [place] [address]    Report Status PENDING  Incomplete  Resp panel by RT-PCR (RSV, Flu A&B, Covid) Anterior Nasal Swab     Status: None   Collection Time: 08/09/23 12:37 PM   Specimen: Anterior Nasal Swab  Result Value Ref Range Status   SARS Coronavirus 2 by RT PCR NEGATIVE NEGATIVE Final    Comment: (NOTE) SARS-CoV-2 target nucleic acids are NOT DETECTED.  The SARS-CoV-2 RNA is generally detectable  in upper respiratory specimens during the acute phase of infection. The lowest concentration of SARS-CoV-2 viral copies this assay can detect is 138 copies/mL. A negative result does not preclude SARS-Cov-2 infection and should not be used as the sole basis for treatment or other patient management decisions. A negative result may occur with  improper specimen collection/handling, submission of specimen other than nasopharyngeal swab, presence of viral mutation(s) within the areas targeted by this assay, and inadequate number of viral copies(<138 copies/mL). A negative result must be combined with clinical observations, patient history, and epidemiological information. The expected result is Negative.  Fact Sheet for Patients:  bloggercourse.com  Fact Sheet for Healthcare Providers:  seriousbroker.it  This test is no t yet approved or cleared by the [place]  FDA and  has been authorized for detection and/or diagnosis of SARS-CoV-2 by FDA under an Emergency Use Authorization (EUA). This EUA will remain  in effect (meaning this test can be used) for the duration of the COVID-19 declaration under Section 564(b)(1) of the Act, 21 U.S.C.section 360bbb-3(b)(1), unless the authorization is terminated  or revoked sooner.       Influenza A by PCR NEGATIVE NEGATIVE Final   Influenza B by PCR NEGATIVE NEGATIVE Final    Comment: (NOTE) The Xpert Xpress SARS-CoV-2/FLU/RSV plus assay is intended as an aid in the diagnosis of influenza from Nasopharyngeal swab specimens and should not be used as a sole basis for treatment. Nasal washings and aspirates are unacceptable for Xpert Xpress SARS-CoV-2/FLU/RSV testing.  Fact Sheet for Patients: bloggercourse.com  Fact Sheet for Healthcare Providers: seriousbroker.it  This test is not yet approved or cleared by the [place]  FDA and has  been authorized for detection and/or diagnosis of SARS-CoV-2 by FDA under an Emergency Use Authorization (EUA). This EUA will remain in effect (meaning this test can be used) for the duration of the COVID-19 declaration under Section 564(b)(1) of the Act, 21 U.S.C. section 360bbb-3(b)(1), unless the authorization is terminated or revoked.     Resp Syncytial Virus by PCR NEGATIVE NEGATIVE Final    Comment: (NOTE) Fact Sheet for Patients: bloggercourse.com  Fact Sheet for Healthcare Providers: seriousbroker.it  This test is not yet approved or cleared by the [place]  FDA and has been authorized for detection and/or diagnosis of SARS-CoV-2 by FDA under an Emergency Use Authorization (EUA). This EUA will remain in effect (meaning this test can be used) for the duration of the COVID-19 declaration under Section 564(b)(1) of the Act, 21 U.S.C. section 360bbb-3(b)(1), unless the authorization is terminated or revoked.  Performed at [facility], [address], [place], [place] [address]   MRSA Next Gen by PCR, Nasal     Status: None   Collection Time: 08/11/23  2:52 PM   Specimen: Nasal Mucosa; Nasal Swab  Result Value Ref Range Status   MRSA by PCR Next Gen NOT DETECTED NOT DETECTED Final    Comment: (NOTE) The GeneXpert MRSA Assay (FDA approved for NASAL specimens only), is one component of a comprehensive MRSA colonization surveillance program. It is not intended to diagnose MRSA infection nor to guide or monitor treatment for MRSA infections. Test performance is not FDA approved in patients less than 7 years old. Performed at [facility], [address], [place], [place] [address]          Radiology Studies: DG CHEST PORT 1 VIEW Result Date: 08/12/2023 CLINICAL DATA:  Short of breath EXAM: PORTABLE CHEST 1 VIEW COMPARISON:  None Available. FINDINGS: Markedly enlarged cardiac silhouette. No interval change. No effusion,  infiltrate, or pneumothorax. No acute osseous abnormality. IMPRESSION: Cardiomegaly.  No interval change. Electronically Signed   By: Jackquline Boxer M.D.   On: 08/12/2023 08:27   DG CHEST PORT 1 VIEW Result Date: 08/11/2023 CLINICAL DATA:  Severe shortness of breath. EXAM: PORTABLE CHEST 1 VIEW COMPARISON:  08/09/2023 FINDINGS: Stable significantly enlarged cardiac silhouette. Mild prominence of the pulmonary vasculature and interstitial markings with improvement. Probable small bilateral pleural effusions. Unremarkable bones. IMPRESSION: Cardiomegaly and mild congestive heart failure with improvement. Electronically Signed   By: Elspeth Bathe M.D.   On: 08/11/2023 17:18  Scheduled Meds:  apixaban   5 mg Oral BID   Chlorhexidine  Gluconate Cloth  6 each Topical Daily   doxycycline   100 mg Oral Q12H   furosemide   40 mg Intravenous Daily   ipratropium  0.5 mg Nebulization Q6H   levalbuterol   0.63 mg Nebulization Q6H   metoprolol  succinate  100 mg Oral Daily   rosuvastatin   20 mg Oral QHS   Continuous Infusions:  amiodarone  30 mg/hr (08/12/23 1204)   cefTRIAXone  (ROCEPHIN )  IV Stopped (08/11/23 1805)      LOS: 3 days    Time spent: 52 minutes spent on chart review, discussion with nursing staff, consultants, updating family and interview/physical exam; more than 50% of that time was spent in counseling and/or coordination of care.    Camellia PARAS Maitland Lesiak, DO [facility] Available via Epic secure chat 7am-7pm After these hours, please refer to coverage provider listed on [email or web address] 08/12/2023, 1:06 PM

## 2023-08-13 DIAGNOSIS — I340 Nonrheumatic mitral (valve) insufficiency: Secondary | ICD-10-CM

## 2023-08-13 DIAGNOSIS — I361 Nonrheumatic tricuspid (valve) insufficiency: Secondary | ICD-10-CM

## 2023-08-13 DIAGNOSIS — I5020 Unspecified systolic (congestive) heart failure: Secondary | ICD-10-CM

## 2023-08-13 DIAGNOSIS — I4891 Unspecified atrial fibrillation: Secondary | ICD-10-CM | POA: Diagnosis not present

## 2023-08-13 LAB — CBC
HCT: 35.8 % — ABNORMAL LOW (ref 39.0–52.0)
Hemoglobin: 11 g/dL — ABNORMAL LOW (ref 13.0–17.0)
MCH: 31.4 pg (ref 26.0–34.0)
MCHC: 30.7 g/dL (ref 30.0–36.0)
MCV: 102.3 fL — ABNORMAL HIGH (ref 80.0–100.0)
Platelets: 159 10*3/uL (ref 150–400)
RBC: 3.5 MIL/uL — ABNORMAL LOW (ref 4.22–5.81)
RDW: 13.9 % (ref 11.5–15.5)
WBC: 8.1 10*3/uL (ref 4.0–10.5)
nRBC: 0 % (ref 0.0–0.2)

## 2023-08-13 LAB — BASIC METABOLIC PANEL
Anion gap: 9 (ref 5–15)
BUN: 48 mg/dL — ABNORMAL HIGH (ref 8–23)
CO2: 21 mmol/L — ABNORMAL LOW (ref 22–32)
Calcium: 8.1 mg/dL — ABNORMAL LOW (ref 8.9–10.3)
Chloride: 105 mmol/L (ref 98–111)
Creatinine, Ser: 2.31 mg/dL — ABNORMAL HIGH (ref 0.61–1.24)
GFR, Estimated: 27 mL/min — ABNORMAL LOW (ref >60)
Glucose, Bld: 124 mg/dL — ABNORMAL HIGH (ref 70–99)
Potassium: 4.4 mmol/L (ref 3.5–5.1)
Sodium: 135 mmol/L (ref 135–145)

## 2023-08-13 LAB — BRAIN NATRIURETIC PEPTIDE: B Natriuretic Peptide: 1173 pg/mL — ABNORMAL HIGH (ref 0.0–100.0)

## 2023-08-13 MED ORDER — ORAL CARE MOUTH RINSE: 15.0000 mL | OROMUCOSAL | Status: DC | PRN

## 2023-08-13 MED ORDER — PANTOPRAZOLE SODIUM 40 MG PO TBEC
40.0000 mg | DELAYED_RELEASE_TABLET | Freq: Every day | ORAL | Status: DC
  Administered 2023-08-13 – 2023-08-17 (×5): 40 mg via ORAL
  Filled 2023-08-13 (×5): qty 1

## 2023-08-13 NOTE — Consult Note (Signed)
 Cardiology Consultation:   Patient ID: Juan Arnold; [id number]; 02/16/1935   Admit date: 08/09/2023 Date of Consult: 08/13/2023  Primary Care Provider: Lenon Layman ORN, MD Primary Cardiologist: Marsa Dooms, MD  History of Present Illness:   Juan Arnold is a 89 y.o. male patient of Dr. Dooms with the [facility] clinic, history of nonobstructive CAD by cardiac catheterization in 2023, CKD stage IIIb, sinus node dysfunction, frequent PACs and PVCs, mitral and tricuspid regurgitation, hyperlipidemia, and left lower extremity DVT on Eliquis .  He is currently admitted to the hospital with worsening shortness of breath and cough as well as fatigue, being treated for pneumonia and with evidence of newly documented atrial fibrillation with RVR.  He has been treated with oral metoprolol  and IV amiodarone  per primary team, continued on Eliquis .  Follow-up echocardiogram from February 3 revealed LVEF 40% with global hypokinesis, low normal RV contraction, severe biatrial enlargement, moderate mitral regurgitation, and severe tricuspid regurgitation.  Cardiology consulted to assist with management.  Patient states that he feels better in terms of breathing and cough, no chest pain and no major sense of palpitations.  Follow-up chest x-ray yesterday indicated cardiomegaly with no infiltrates or effusions.  ROS:  Pertinent review in history of present illness.  Reports long time history of intermittent palpitations, unclear if any previous diagnosis of atrial fibrillation however.  Does have history of frequent atrial and ventricular ectopy as indicated.  Past Medical History:  Diagnosis Date   Aortic atherosclerosis (HCC)    Bilateral inguinal hernia    Bradycardia    CAD (coronary artery disease) 07/23/2021   a.) LHC 07/23/2021: 40% mLM, 30% p-mLAD, 20/20% pRCA, 30% mRCA - med mgmt   CKD (chronic kidney disease) stage 3, GFR 30-59 ml/min (HCC)    Colon cancer (HCC)    a.) s/p  partial colectomy (LAR); they removed 14-17 inches of my colon   DVT femoral (deep venous thrombosis) with thrombophlebitis, left (HCC) 02/19/2023   a.) lower extremity duplex 02/19/2023: LEFT calf DVT and superficial venous thrombophlebitis   Dyspnea    Edema    GERD (gastroesophageal reflux disease)    Heart murmur    History of bilateral cataract extraction 2018   HLD (hyperlipidemia)    HOH; uses BILATERAL hearing aids    Hypertension    IBS (irritable bowel syndrome)    Non-traumatic compression fracture of L3 lumbar vertebra (HCC)    On apixaban  therapy    Palpitations (PVC/PACs)    Pre-diabetes    PUD (peptic ulcer disease)    RBBB (right bundle branch block)    Renal cyst    SA node dysfunction (HCC)    Secondary hyperparathyroidism (HCC)    Valvular insufficiency 05/06/2021   a.) TTE 05/06/2021: EF >55%, mild LVH, sev LAE, mod RAE, triv AR, mod PR, mod-sev TR, sev MR, RVSP 71.8    Past Surgical History:  Procedure Laterality Date   APPENDECTOMY  1955   ruptured!   CATARACT EXTRACTION W/PHACO Left 10/29/2016   Procedure: CATARACT EXTRACTION PHACO AND INTRAOCULAR LENS PLACEMENT (IOC);  Surgeon: Steven Dingeldein, MD;  Location: [facility] ORS;  Service: Ophthalmology;  Laterality: Left;  US  2:05.4 AP% 25.3 CDE 49.50 FLUID PACK LOT # [id number]   CATARACT EXTRACTION W/PHACO Right 11/26/2016   Procedure: CATARACT EXTRACTION PHACO AND INTRAOCULAR LENS PLACEMENT (IOC);  Surgeon: Dingeldein, Steven, MD;  Location: [facility] ORS;  Service: Ophthalmology;  Laterality: Right;  US  1:36.9 AP% 22.2 CDE 33.97 FLUID PACK LOT # [id number] H   COLECTOMY  INGUINAL HERNIA REPAIR Right 1954   LEFT HEART CATH AND CORONARY ANGIOGRAPHY N/A 07/23/2021   Procedure: LEFT HEART CATH AND CORONARY ANGIOGRAPHY;  Surgeon: Ammon Blunt, MD;  Location: [facility] INV[facility]IVE [facility];  Service: Cardiovascular;  Laterality: N/A;   PARTIAL COLECTOMY  2006   PILONIDAL CYST EXCISION       Inpatient  Medications: Scheduled Meds:  apixaban   5 mg Oral BID   Chlorhexidine  Gluconate Cloth  6 each Topical Daily   doxycycline   100 mg Oral Q12H   ipratropium  0.5 mg Nebulization Q6H   levalbuterol   0.63 mg Nebulization Q6H   metoprolol  succinate  100 mg Oral Daily   rosuvastatin   20 mg Oral QHS   Continuous Infusions:  amiodarone  30 mg/hr (08/13/23 0509)   cefTRIAXone  (ROCEPHIN )  IV 2 g (08/12/23 1720)   PRN Meds: acetaminophen  **OR** acetaminophen , famotidine , guaiFENesin -dextromethorphan , levalbuterol , metoprolol  tartrate, ondansetron  **OR** ondansetron  (ZOFRAN ) IV, polyethylene glycol, traMADol   Allergies:    Allergies  Allergen Reactions   Plasticized Base [Plastibase]     Pt states he had blood clot in arm from a plastic device    Social History:   Social History   Tobacco Use   Smoking status: Former    Current packs/day: 0.00    Types: Cigarettes    Quit date: 07/07/1993    Years since quitting: 30.1   Smokeless tobacco: Never  Substance Use Topics   Alcohol use: No    Family History:   The patient's family history is not on file.  Physical Exam/Data:   Vitals:   08/13/23 0745 08/13/23 0800 08/13/23 0900 08/13/23 0924  BP:  (!) 120/91  130/80  Pulse:  (!) 102  93  Resp:  17    Temp:      TempSrc:      SpO2: 95%  100%   Weight:      Height:        Intake/Output Summary (Last 24 hours) at 08/13/2023 1108 Last data filed at 08/12/2023 1803 Gross per 24 hour  Intake 627.29 ml  Output --  Net 627.29 ml   Filed Weights   08/11/23 0447 08/11/23 1457 08/13/23 0500  Weight: 89.7 kg 89.5 kg 90.5 kg   Body mass index is 27.06 kg/m.   Gen: Patient appears comfortable at rest. HEENT: Conjunctiva and lids normal, oropharynx clear. Neck: Supple, no elevated JVP or carotid bruits. Lungs: Clear to auscultation, nonlabored breathing at rest. Cardiac: Irregularly irregular, 2/6 apical systolic murmur, no gallop. Abdomen: Soft, nontender, bowel sounds  present. Extremities: No pitting edema, distal pulses 2+. Skin: Warm and dry. Musculoskeletal: No kyphosis. Neuropsychiatric: Alert and oriented x3, affect grossly appropriate.  EKG:  An ECG dated 08/11/2023 was personally reviewed today and demonstrated:  Atrial fibrillation with RVR, right bundle branch block and left anterior fascicular block.  Telemetry:  I personally reviewed telemetry which shows rate controlled atrial fibrillation.  Relevant CV Studies:  Echocardiogram 05/06/2021 [facility]): INTERPRETATION  NORMAL LEFT VENTRICULAR SYSTOLIC FUNCTION   WITH MILD LVH  NORMAL RIGHT VENTRICULAR SYSTOLIC FUNCTION  NO VALVULAR STENOSIS  MODERATE to SEVERE TR  SEVERE MR  TRIVIAL AR  MILD PR  EF 55%   Laboratory Data:  Chemistry Recent Labs  Lab 08/11/23 0409 08/12/23 0507 08/13/23 0428  NA 141 139 135  K 4.7 4.1 4.4  CL 112* 107 105  CO2 21* 21* 21*  GLUCOSE 110* 129* 124*  BUN 35* 38* 48*  CREATININE 1.66* 2.10* 2.31*  CALCIUM  8.5* 8.3* 8.1*  GFRNONAA 40* 30* 27*  ANIONGAP 8 11 9      Hematology Recent Labs  Lab 08/11/23 1752 08/12/23 0507 08/13/23 0428  WBC 13.4* 9.8 8.1  RBC 3.60* 3.42* 3.50*  HGB 11.8* 11.0* 11.0*  HCT 38.5* 36.0* 35.8*  MCV 106.9* 105.3* 102.3*  MCH 32.8 32.2 31.4  MCHC 30.6 30.6 30.7  RDW 14.1 14.0 13.9  PLT 205 171 159   Cardiac Enzymes Recent Labs  Lab 08/09/23 1007 08/09/23 1335  TROPONINIHS 25* 23*   BNP Recent Labs  Lab 08/11/23 0409 08/12/23 0507 08/13/23 0428  BNP 1,136.0* 1,172.0* 1,173.0*     Radiology/Studies:  DG CHEST PORT 1 VIEW Result Date: 08/12/2023 CLINICAL DATA:  Short of breath EXAM: PORTABLE CHEST 1 VIEW COMPARISON:  None Available. FINDINGS: Markedly enlarged cardiac silhouette. No interval change. No effusion, infiltrate, or pneumothorax. No acute osseous abnormality. IMPRESSION: Cardiomegaly.  No interval change. Electronically Signed   By: Jackquline Boxer M.D.   On: 08/12/2023 08:27   DG CHEST  PORT 1 VIEW Result Date: 08/11/2023 CLINICAL DATA:  Severe shortness of breath. EXAM: PORTABLE CHEST 1 VIEW COMPARISON:  08/09/2023 FINDINGS: Stable significantly enlarged cardiac silhouette. Mild prominence of the pulmonary vasculature and interstitial markings with improvement. Probable small bilateral pleural effusions. Unremarkable bones. IMPRESSION: Cardiomegaly and mild congestive heart failure with improvement. Electronically Signed   By: Elspeth Bathe M.D.   On: 08/11/2023 17:18   ECHOCARDIOGRAM COMPLETE Result Date: 08/10/2023    ECHOCARDIOGRAM REPORT   Patient Name:   Juan Arnold Date of Exam: 08/10/2023 Medical Rec #:  [id number]      Height:       72.0 in Accession #:    [id number]     Weight:       180.0 lb Date of Birth:  Apr 02, 1935     BSA:          2.037 m Patient Age:    88 years       BP:           124/79 mmHg Patient Gender: M              HR:           81 bpm. Exam Location:  [facility] Procedure: 2D Echo, Cardiac Doppler, Color Doppler and Intracardiac            Opacification Agent Indications:    Atrial Fibrillation I48.91  History:        Patient has no prior history of Echocardiogram examinations.                 Arrythmias:RBBB and Atrial Fibrillation; Risk                 Factors:Hypertension.  Sonographer:    Lanell Maduro Referring Phys: [id number] EJIROGHENE E EMOKPAE IMPRESSIONS  1. Left ventricular ejection fraction, by estimation, is 40%. The left ventricle has moderately decreased function. The left ventricle demonstrates global hypokinesis. The left ventricular internal cavity size was moderately dilated. There is mild left ventricular hypertrophy. Left ventricular diastolic parameters are indeterminate.  2. Right ventricular systolic function is low normal. The right ventricular size is normal.  3. Left atrial size was severely dilated.  4. Right atrial size was severely dilated.  5. Moderate mitral valve regurgitation.  6. Tricuspid valve regurgitation is severe.  7. The aortic  valve is tricuspid. Aortic valve regurgitation is not visualized. Aortic valve sclerosis is present, with no evidence of aortic valve stenosis.  8.  The inferior vena cava is dilated in size with <50% respiratory variability, suggesting right atrial pressure of 15 mmHg. FINDINGS  Left Ventricle: Left ventricular ejection fraction, by estimation, is 40%. The left ventricle has moderately decreased function. The left ventricle demonstrates global hypokinesis. The left ventricular internal cavity size was moderately dilated. There is mild left ventricular hypertrophy. Left ventricular diastolic parameters are indeterminate. Right Ventricle: The right ventricular size is normal. Right vetricular wall thickness was not assessed. Right ventricular systolic function is low normal. Left Atrium: Left atrial size was severely dilated. Right Atrium: Right atrial size was severely dilated. Pericardium: There is no evidence of pericardial effusion. Mitral Valve: There is mild thickening of the mitral valve leaflet(s). Moderate mitral valve regurgitation. Tricuspid Valve: The tricuspid valve is normal in structure. Tricuspid valve regurgitation is severe. Aortic Valve: The aortic valve is tricuspid. Aortic valve regurgitation is not visualized. Aortic valve sclerosis is present, with no evidence of aortic valve stenosis. Pulmonic Valve: The pulmonic valve was not well visualized. Pulmonic valve regurgitation is not visualized. Aorta: The aortic root and ascending aorta are structurally normal, with no evidence of dilitation. Venous: The inferior vena cava is dilated in size with less than 50% respiratory variability, suggesting right atrial pressure of 15 mmHg. IAS/Shunts: No atrial level shunt detected by color flow Doppler.  LEFT VENTRICLE PLAX 2D LVIDd:         6.00 cm      Diastology LVIDs:         2.65 cm      LV e' medial:   7.62 cm/s LV PW:         1.20 cm      LV E/e' medial: 10.9 LV IVS:        1.20 cm LVOT diam:     2.50  cm LV SV:         57 LV SV Index:   28 LVOT Area:     4.91 cm  LV Volumes (MOD) LV vol d, MOD A2C: 119.0 ml LV vol d, MOD A4C: 108.0 ml LV vol s, MOD A2C: 42.6 ml LV vol s, MOD A4C: 48.4 ml LV SV MOD A2C:     76.4 ml LV SV MOD A4C:     108.0 ml LV SV MOD BP:      68.8 ml RIGHT VENTRICLE             IVC RV Basal diam:  4.90 cm     IVC diam: 3.10 cm RV Mid diam:    3.00 cm RV S prime:     10.30 cm/s TAPSE (M-mode): 2.0 cm LEFT ATRIUM              Index        RIGHT ATRIUM           Index LA diam:        5.20 cm  2.55 cm/m   RA Area:     43.40 cm LA Vol (A2C):   110.0 ml 53.99 ml/m  RA Volume:   188.00 ml 92.28 ml/m LA Vol (A4C):   124.0 ml 60.87 ml/m LA Biplane Vol: 130.0 ml 63.81 ml/m  AORTIC VALVE LVOT Vmax:   59.60 cm/s LVOT Vmean:  41.867 cm/s LVOT VTI:    0.116 m  AORTA Ao Root diam: 3.70 cm Ao Asc diam:  3.50 cm MITRAL VALVE               TRICUSPID VALVE MV Area (PHT):  3.68 cm    TR Peak grad:   18.7 mmHg MV Decel Time: 206 msec    TR Vmax:        216.00 cm/s MR Peak grad: 88.0 mmHg MR Mean grad: 52.0 mmHg    SHUNTS MR Vmax:      469.00 cm/s  Systemic VTI:  0.12 m MR Vmean:     336.0 cm/s   Systemic Diam: 2.50 cm MV E velocity: 83.40 cm/s Vina Gull MD Electronically signed by Vina Gull MD Signature Date/Time: 08/10/2023/8:08:10 PM    Final    NM Pulmonary Perfusion Result Date: 08/09/2023 CLINICAL DATA:  Rule out pulmonary embolism. Complains of shortness of breath and cough. EXAM: NUCLEAR MEDICINE PERFUSION LUNG SCAN TECHNIQUE: Perfusion images were obtained in multiple projections after intravenous injection of radiopharmaceutical. Ventilation scans intentionally deferred if perfusion scan and chest x-ray adequate for interpretation during COVID 19 epidemic. RADIOPHARMACEUTICALS:  4.1 mCi Tc-71m MAA IV COMPARISON:  Chest radiograph from 08/09/2023. FINDINGS: The chest radiograph from earlier today shows cardiac enlargement and signs suggestive of early CHF. No peripheral, no evidence segmental  perfusion defects identified within the lungs identified to indicate acute pulmonary embolus. IMPRESSION: Negative for acute pulmonary embolus. Electronically Signed   By: Waddell Calk M.D.   On: 08/09/2023 15:34    Assessment and Plan:   1.  Newly documented atrial fibrillation with RVR in the setting of suspected pneumonia.  CHA2DS2-VASc score is 5.  He is already anticoagulated with Eliquis  for concurrent treatment of left lower extremity DVT.  Presently on Toprol -XL and IV amiodarone  with good heart rate control at this time.  2.  HFrEF, echocardiogram demonstrating LVEF approximately 40% with global hypokinesis.  Most likely nonischemic etiology and potentially even tachycardia induced depending on overall duration of atrial fibrillation.  Prior echocardiogram through [facility] back in 2002 indicated LVEF 55%.  3.  Moderate mitral regurgitation and severe tricuspid regurgitation.  Echocardiogram from 2002 indicated similar findings, although severe mitral regurgitation at that time. Dr. Ammon notes indicate that mitral regurgitation was much less significant at cardiac catheterization.  4.  Nonobstructive CAD by cardiac catheterization in 2023.  Present cardiac enzymes are minimally elevated and not consistent with ACS.  He does not report any angina.  5.  CKD stage IIIb, creatinine 2.31 and GFR 27.  Continue supportive measures and treatment for pneumonia per primary team.  Continue Eliquis  and Toprol -XL.  Anticipate temporary use of IV amiodarone , most likely this can be consolidated to beta-blocker therapy alone as he continues to improve clinically.  GDMT for treatment of HFrEF will be limited by blood pressure and renal insufficiency, this can be adjusted gradually depending on how he does (not good candidate for MRA, Entresto, or SGLT2 inhibitor at this point).  Do not anticipate further ischemic workup.  For questions or updates, please contact [facility] Please consult  [email or web address] for contact info under   Signed, Jayson Sierras, MD  08/13/2023 11:08 AM

## 2023-08-13 NOTE — Progress Notes (Signed)
 PROGRESS NOTE    Juan Arnold  FMW:[id number] DOB: Mar 23, 1935 DOA: 08/09/2023 PCP: Lenon Layman ORN, MD    Brief Narrative:   Juan Arnold is a 89 y.o. male with past medical history significant for HTN, history of DVT on Eliquis , CKD stage IIIb, CAD, peptic ulcer disease, history of colon cancer who presented to [facility] ED on 08/09/2023 with progressive shortness of breath over the last week.  Patient reports worse with exertion and associated with fatigue.  Also endorses worsening cough over the last 2 weeks.  Started on Eliquis  for DVT 2-3 months ago and reports compliance.  Remote history of tobacco use, currently inactive.  Denies chest pain, no palpitations, no fever/chills, no nausea/vomiting/diarrhea, no abdominal pain, no urinary symptoms.  In the ED, temperature 98.6 F, HR 112, RR 23, BP 118/90, SpO2 97% on room air.  WBC 8.6, hemoglobin 12.1, platelet count 206.  Sodium 142, potassium 4.5, chloride 111, CO2 22, glucose 138, BUN 34, creat 1.63.  BNP 1193.0.  High sensitive troponin 25>23.  Chest x-ray with small pleural effusion, emphysema.  Patient was given Cardizem  10 mg IV bolus followed by continuous infusion.  VQ scan negative for PE.  TRH consulted for admission for further evaluation management of new onset A-fib with RVR.  Assessment & Plan:   Paroxysmal atrial fibrillation with RVR, new diagnosis Patient dying with progressive weakness, dyspnea, fatigue.  Was noted to be in A-fib with RVR with heart rate 132 on EKG.  No previous diagnosis of A-fib.  Currently on Eliquis  outpatient for recent diagnosis of DVT.  TSH 1.420, within normal limits.  TTE with LVEF 40%, LV moderately decreased function with global hypokinesis, LV moderately dilated, mild LVH, biatrial enlargement, moderate MR, IVC dilated. -- Metoprolol  succinate 100 mg p.o. daily -- Amiodarone  drip -- Eliquis  5 mg p.o. twice daily -- Continue monitor on telemetry  Concern for developing  hospital-acquired pneumonia -- Sputum culture, Legionella/strep pneumo antigen -- Azithromycin  500 mg IV every 24 hours x 5 days -- Ceftriaxone  2 g IV every 24 hours x 5 days  Acute on chronic systolic congestive heart failure, new diagnosis TTE with LVEF 40%, LV moderately decreased function with global hypokinesis, LV moderately dilated, mild LVH, biatrial enlargement, moderate MR, IVC dilated. -- BNP 1193>1136>1172.0>1173.0 -- Hold further IV diuresis today due to uptrending creatinine -- 1500 mL fluid restriction -- Strict I's and O's, daily weights  Elevated troponin Troponin elevated initially at 25 followed by 23, patient denies chest pain.  Etiology likely secondary to type II demand ischemia in the setting of A-fib with RVR as above. -- Continue monitor on telemetry  Dyspnea Cough Patient complaining of progressive shortness of breath, cough over the last several weeks.  Not hypoxic and not requiring oxygen therapy.  Etiology likely secondary to mild vascular congestion from A-fib with RVR.  COVID, influenza, RSV PCR negative.  BNP elevated 1193.  Chest x-ray with cardiomegaly, questionable left pleural effusion.  Nuclear medicine VQ scan negative for PE.  History of DVT -- Eliquis  5 mg p.o. twice daily  AKI on CKD stage IIIb -- Cr 1.63>1.72>1.66>2.10>2.31 (baseline 1.50 05/18/2023) -- Holding further IV diuresis today due to uptrending creatinine -- Avoid nephrotoxins, renally dose all medications -- Repeat BMP in a.m.  Nonobstructive CAD Underwent left heart catheterization 07/23/2021 with mid LM lesion 40% stenosis, proximal LAD-mid LAD lesion 30% stenosed, proximal RCA-1 lesion 20% stenosed, proximal RCA-2 lesion 20% stenosed, mid RCA lesion 30% stenosis, LVEF 55-65%. --  Crestor  20 mg p.o. daily  Essential hypertension Home regimen includes metoprolol  succinate 25 mg p.o. daily, olmesartan  40 mg p.o. daily. -- Metoprolol  succinate increased to 100 mg p.o. daily as  above -- Hold home olmesartan  for now -- Monitor BP  Peptic ulcer disease Currently not on medication outpatient. -- pepcid  PRN    DVT prophylaxis:  apixaban  (ELIQUIS ) tablet 5 mg    Code Status: Full Code Family Communication: No family present at bedside  Disposition Plan:  Level of care: ICU Status is: Inpatient Remains inpatient appropriate because: amiodarone  drip, IV antibiotics    Consultants:  Cardiology  Procedures:  TTE:   Antimicrobials:  None   Subjective: Patient seen examined bedside, resting calmly.  Lying in bed.  No family present.  Patient reports slept well overnight.  Dyspnea improved, heart rate also improved but with some infrequent episodes of RVR overnight.  Discussed with patient will hold further IV diuresis given his uptrending creatinine today.  Awaiting cardiology evaluation.  Patient with no other specific questions, concerns or complaints at this time.  Denies headache, no dizziness, no chest pain, no palpitations, no fever/chills/night sweats, no nausea/vomiting/diarrhea, no focal weakness, no abdominal pain, no paresthesias.  No acute events overnight per nursing staff.  Objective: Vitals:   08/13/23 0745 08/13/23 0800 08/13/23 0900 08/13/23 0924  BP:  (!) 120/91  130/80  Pulse:  (!) 102  93  Resp:  17    Temp:      TempSrc:      SpO2: 95%  100%   Weight:      Height:        Intake/Output Summary (Last 24 hours) at 08/13/2023 1041 Last data filed at 08/12/2023 1803 Gross per 24 hour  Intake 627.29 ml  Output --  Net 627.29 ml   Filed Weights   08/11/23 0447 08/11/23 1457 08/13/23 0500  Weight: 89.7 kg 89.5 kg 90.5 kg    Examination:  Physical Exam: GEN: NAD, alert and oriented x 3, elderly/chronically ill appearance HEENT: NCAT, PERRL, EOMI, sclera clear, MMM PULM: Breath sounds slight diminished bilateral bases, + crackles, normal respiratory effort without accessory muscle use, on 3 L nasal cannula with SpO2 98% at  rest CV: Irregularly irregular rhythm, slightly tachycardic, w/o M/G/R GI: abd soft, NTND, + BS MSK: no peripheral edema, muscle strength globally intact 5/5 bilateral upper/lower extremities NEURO: CN II-XII intact, no focal deficits, sensation to light touch intact PSYCH: normal mood/affect Integumentary: No concerning rashes/lesions/wounds noted on exposed skin surfaces    Data Reviewed: I have personally reviewed following labs and imaging studies  CBC: Recent Labs  Lab 08/09/23 1007 08/10/23 0530 08/11/23 1752 08/12/23 0507 08/13/23 0428  WBC 8.6 8.4 13.4* 9.8 8.1  HGB 12.1* 11.3* 11.8* 11.0* 11.0*  HCT 38.9* 36.1* 38.5* 36.0* 35.8*  MCV 102.9* 104.3* 106.9* 105.3* 102.3*  PLT 206 190 205 171 159   Basic Metabolic Panel: Recent Labs  Lab 08/09/23 1007 08/09/23 1700 08/10/23 0530 08/11/23 0409 08/12/23 0507 08/13/23 0428  NA 142  --  142 141 139 135  K 4.5  --  4.8 4.7 4.1 4.4  CL 111  --  112* 112* 107 105  CO2 22  --  21* 21* 21* 21*  GLUCOSE 138*  --  121* 110* 129* 124*  BUN 34*  --  35* 35* 38* 48*  CREATININE 1.63*  --  1.72* 1.66* 2.10* 2.31*  CALCIUM  8.7*  --  8.6* 8.5* 8.3* 8.1*  MG  --  2.2  --  2.1 2.1  --    GFR: Estimated Creatinine Clearance: 25.2 mL/min (A) (by C-G formula based on SCr of 2.31 mg/dL (H)). Liver Function Tests: No results for input(s): AST, ALT, ALKPHOS, BILITOT, PROT, ALBUMIN  in the last 168 hours. No results for input(s): LIPASE, AMYLASE in the last 168 hours. No results for input(s): AMMONIA in the last 168 hours. Coagulation Profile: No results for input(s): INR, PROTIME in the last 168 hours. Cardiac Enzymes: No results for input(s): CKTOTAL, CKMB, CKMBINDEX, TROPONINI in the last 168 hours. BNP (last 3 results) No results for input(s): PROBNP in the last 8760 hours. HbA1C: No results for input(s): HGBA1C in the last 72 hours. CBG: Recent Labs  Lab 08/11/23 1425  GLUCAP 150*    Lipid Profile: No results for input(s): CHOL, HDL, LDLCALC, TRIG, CHOLHDL, LDLDIRECT in the last 72 hours. Thyroid Function Tests: No results for input(s): TSH, T4TOTAL, FREET4, T3FREE, THYROIDAB in the last 72 hours.  Anemia Panel: No results for input(s): VITAMINB12, FOLATE, FERRITIN, TIBC, IRON, RETICCTPCT in the last 72 hours. Sepsis Labs: Recent Labs  Lab 08/09/23 1022 08/09/23 1335 08/11/23 1752 08/12/23 0507  PROCALCITON  --   --  0.12 0.11  LATICACIDVEN 1.5 1.3  --   --     Recent Results (from the past 240 hours)  Blood culture (routine x 2)     Status: None (Preliminary result)   Collection Time: 08/09/23 10:28 AM   Specimen: BLOOD  Result Value Ref Range Status   Specimen Description BLOOD LEFT ANTECUBITAL  Final   Special Requests   Final    BOTTLES DRAWN AEROBIC AND ANAEROBIC Blood Culture results may not be optimal due to an inadequate volume of blood received in culture bottles   Culture   Final    NO GROWTH 4 DAYS Performed at [facility], [address], [place], [place] [address]    Report Status PENDING  Incomplete  Blood culture (routine x 2)     Status: None (Preliminary result)   Collection Time: 08/09/23 11:41 AM   Specimen: BLOOD  Result Value Ref Range Status   Specimen Description BLOOD RIGHT ANTECUBITAL  Final   Special Requests   Final    BOTTLES DRAWN AEROBIC AND ANAEROBIC Blood Culture results may not be optimal due to an inadequate volume of blood received in culture bottles   Culture   Final    NO GROWTH 4 DAYS Performed at [facility], [address] [place], [place] [address]    Report Status PENDING  Incomplete  Resp panel by RT-PCR (RSV, Flu A&B, Covid) Anterior Nasal Swab     Status: None   Collection Time: 08/09/23 12:37 PM   Specimen: Anterior Nasal Swab  Result Value Ref Range Status   SARS Coronavirus 2 by RT PCR NEGATIVE NEGATIVE Final    Comment: (NOTE) SARS-CoV-2 target nucleic acids  are NOT DETECTED.  The SARS-CoV-2 RNA is generally detectable in upper respiratory specimens during the acute phase of infection. The lowest concentration of SARS-CoV-2 viral copies this assay can detect is 138 copies/mL. A negative result does not preclude SARS-Cov-2 infection and should not be used as the sole basis for treatment or other patient management decisions. A negative result may occur with  improper specimen collection/handling, submission of specimen other than nasopharyngeal swab, presence of viral mutation(s) within the areas targeted by this assay, and inadequate number of viral copies(<138 copies/mL). A negative result must be combined with clinical observations, patient  history, and epidemiological information. The expected result is Negative.  Fact Sheet for Patients:  bloggercourse.com  Fact Sheet for Healthcare Providers:  seriousbroker.it  This test is no t yet approved or cleared by the [place]  FDA and  has been authorized for detection and/or diagnosis of SARS-CoV-2 by FDA under an Emergency Use Authorization (EUA). This EUA will remain  in effect (meaning this test can be used) for the duration of the COVID-19 declaration under Section 564(b)(1) of the Act, 21 U.S.C.section 360bbb-3(b)(1), unless the authorization is terminated  or revoked sooner.       Influenza A by PCR NEGATIVE NEGATIVE Final   Influenza B by PCR NEGATIVE NEGATIVE Final    Comment: (NOTE) The Xpert Xpress SARS-CoV-2/FLU/RSV plus assay is intended as an aid in the diagnosis of influenza from Nasopharyngeal swab specimens and should not be used as a sole basis for treatment. Nasal washings and aspirates are unacceptable for Xpert Xpress SARS-CoV-2/FLU/RSV testing.  Fact Sheet for Patients: bloggercourse.com  Fact Sheet for Healthcare Providers: seriousbroker.it  This test is  not yet approved or cleared by the [place]  FDA and has been authorized for detection and/or diagnosis of SARS-CoV-2 by FDA under an Emergency Use Authorization (EUA). This EUA will remain in effect (meaning this test can be used) for the duration of the COVID-19 declaration under Section 564(b)(1) of the Act, 21 U.S.C. section 360bbb-3(b)(1), unless the authorization is terminated or revoked.     Resp Syncytial Virus by PCR NEGATIVE NEGATIVE Final    Comment: (NOTE) Fact Sheet for Patients: bloggercourse.com  Fact Sheet for Healthcare Providers: seriousbroker.it  This test is not yet approved or cleared by the [place]  FDA and has been authorized for detection and/or diagnosis of SARS-CoV-2 by FDA under an Emergency Use Authorization (EUA). This EUA will remain in effect (meaning this test can be used) for the duration of the COVID-19 declaration under Section 564(b)(1) of the Act, 21 U.S.C. section 360bbb-3(b)(1), unless the authorization is terminated or revoked.  Performed at [facility], [address] [place], [place] [address]   MRSA Next Gen by PCR, Nasal     Status: None   Collection Time: 08/11/23  2:52 PM   Specimen: Nasal Mucosa; Nasal Swab  Result Value Ref Range Status   MRSA by PCR Next Gen NOT DETECTED NOT DETECTED Final    Comment: (NOTE) The GeneXpert MRSA Assay (FDA approved for NASAL specimens only), is one component of a comprehensive MRSA colonization surveillance program. It is not intended to diagnose MRSA infection nor to guide or monitor treatment for MRSA infections. Test performance is not FDA approved in patients less than 5 years old. Performed at [facility], [address], [place], [place] [address]          Radiology Studies: DG CHEST PORT 1 VIEW Result Date: 08/12/2023 CLINICAL DATA:  Short of breath EXAM: PORTABLE CHEST 1 VIEW COMPARISON:  None Available. FINDINGS: Markedly  enlarged cardiac silhouette. No interval change. No effusion, infiltrate, or pneumothorax. No acute osseous abnormality. IMPRESSION: Cardiomegaly.  No interval change. Electronically Signed   By: Jackquline Boxer M.D.   On: 08/12/2023 08:27   DG CHEST PORT 1 VIEW Result Date: 08/11/2023 CLINICAL DATA:  Severe shortness of breath. EXAM: PORTABLE CHEST 1 VIEW COMPARISON:  08/09/2023 FINDINGS: Stable significantly enlarged cardiac silhouette. Mild prominence of the pulmonary vasculature and interstitial markings with improvement. Probable small bilateral pleural effusions. Unremarkable bones. IMPRESSION: Cardiomegaly and mild congestive heart failure with improvement. Electronically  Signed   By: Elspeth Bathe M.D.   On: 08/11/2023 17:18        Scheduled Meds:  apixaban   5 mg Oral BID   Chlorhexidine  Gluconate Cloth  6 each Topical Daily   doxycycline   100 mg Oral Q12H   ipratropium  0.5 mg Nebulization Q6H   levalbuterol   0.63 mg Nebulization Q6H   metoprolol  succinate  100 mg Oral Daily   rosuvastatin   20 mg Oral QHS   Continuous Infusions:  amiodarone  30 mg/hr (08/13/23 0509)   cefTRIAXone  (ROCEPHIN )  IV 2 g (08/12/23 1720)      LOS: 4 days    Time spent: 52 minutes spent on chart review, discussion with nursing staff, consultants, updating family and interview/physical exam; more than 50% of that time was spent in counseling and/or coordination of care.    Camellia PARAS Phillip Maffei, DO [facility] Available via Epic secure chat 7am-7pm After these hours, please refer to coverage provider listed on [email or web address] 08/13/2023, 10:41 AM

## 2023-08-13 NOTE — Plan of Care (Signed)
  Problem: Clinical Measurements: Goal: Respiratory complications will improve Outcome: Progressing Goal: Cardiovascular complication will be avoided Outcome: Progressing   Problem: Pain Managment: Goal: General experience of comfort will improve and/or be controlled Outcome: Progressing   Problem: Activity: Goal: Ability to tolerate increased activity will improve Outcome: Progressing   Problem: Clinical Measurements: Goal: Ability to maintain a body temperature in the normal range will improve Outcome: Progressing   Problem: Respiratory: Goal: Ability to maintain adequate ventilation will improve Outcome: Progressing Goal: Ability to maintain a clear airway will improve Outcome: Progressing

## 2023-08-13 NOTE — Plan of Care (Signed)
  Problem: Clinical Measurements: Goal: Respiratory complications will improve Outcome: Progressing Goal: Cardiovascular complication will be avoided Outcome: Progressing   Problem: Pain Managment: Goal: General experience of comfort will improve and/or be controlled Outcome: Progressing   Problem: Activity: Goal: Ability to tolerate increased activity will improve Outcome: Progressing   Problem: Respiratory: Goal: Ability to maintain adequate ventilation will improve Outcome: Progressing Goal: Ability to maintain a clear airway will improve Outcome: Progressing

## 2023-08-13 NOTE — Plan of Care (Signed)
  Problem: Clinical Measurements: Goal: Respiratory complications will improve Outcome: Progressing Goal: Cardiovascular complication will be avoided Outcome: Progressing   Problem: Pain Managment: Goal: General experience of comfort will improve and/or be controlled Outcome: Progressing   Problem: Activity: Goal: Ability to tolerate increased activity will improve Outcome: Progressing   Problem: Clinical Measurements: Goal: Ability to maintain a body temperature in the normal range will improve Outcome: Progressing   Problem: Respiratory: Goal: Ability to maintain adequate ventilation will improve Outcome: Progressing Goal: Ability to maintain a clear airway will improve Outcome: Progressing   Problem: Education: Goal: Knowledge of disease or condition will improve Outcome: Progressing Goal: Understanding of medication regimen will improve Outcome: Progressing Goal: Individualized Educational Video(s) Outcome: Progressing   Problem: Activity: Goal: Ability to tolerate increased activity will improve Outcome: Progressing   Problem: Cardiac: Goal: Ability to achieve and maintain adequate cardiopulmonary perfusion will improve Outcome: Progressing   Problem: Health Behavior/Discharge Planning: Goal: Ability to safely manage health-related needs after discharge will improve Outcome: Progressing

## 2023-08-14 ENCOUNTER — Inpatient Hospital Stay (HOSPITAL_COMMUNITY): Payer: Medicare Other

## 2023-08-14 DIAGNOSIS — I4891 Unspecified atrial fibrillation: Secondary | ICD-10-CM | POA: Diagnosis not present

## 2023-08-14 LAB — CBC
HCT: 34.3 % — ABNORMAL LOW (ref 39.0–52.0)
Hemoglobin: 10.8 g/dL — ABNORMAL LOW (ref 13.0–17.0)
MCH: 32.7 pg (ref 26.0–34.0)
MCHC: 31.5 g/dL (ref 30.0–36.0)
MCV: 103.9 fL — ABNORMAL HIGH (ref 80.0–100.0)
Platelets: 191 10*3/uL (ref 150–400)
RBC: 3.3 MIL/uL — ABNORMAL LOW (ref 4.22–5.81)
RDW: 14.1 % (ref 11.5–15.5)
WBC: 7 10*3/uL (ref 4.0–10.5)
nRBC: 0 % (ref 0.0–0.2)

## 2023-08-14 LAB — CULTURE, BLOOD (ROUTINE X 2)
Culture: NO GROWTH
Culture: NO GROWTH

## 2023-08-14 LAB — BASIC METABOLIC PANEL
Anion gap: 11 (ref 5–15)
BUN: 50 mg/dL — ABNORMAL HIGH (ref 8–23)
CO2: 21 mmol/L — ABNORMAL LOW (ref 22–32)
Calcium: 8 mg/dL — ABNORMAL LOW (ref 8.9–10.3)
Chloride: 103 mmol/L (ref 98–111)
Creatinine, Ser: 2.14 mg/dL — ABNORMAL HIGH (ref 0.61–1.24)
GFR, Estimated: 29 mL/min — ABNORMAL LOW (ref >60)
Glucose, Bld: 115 mg/dL — ABNORMAL HIGH (ref 70–99)
Potassium: 3.8 mmol/L (ref 3.5–5.1)
Sodium: 135 mmol/L (ref 135–145)

## 2023-08-14 LAB — MAGNESIUM: Magnesium: 2 mg/dL (ref 1.7–2.4)

## 2023-08-14 LAB — LEGIONELLA PNEUMOPHILA SEROGP 1 UR AG: L. pneumophila Serogp 1 Ur Ag: NEGATIVE

## 2023-08-14 MED ORDER — DOCUSATE SODIUM 100 MG PO CAPS
100.0000 mg | ORAL_CAPSULE | Freq: Every day | ORAL | Status: DC
  Administered 2023-08-14 – 2023-08-17 (×4): 100 mg via ORAL
  Filled 2023-08-14 (×4): qty 1

## 2023-08-14 MED ORDER — IPRATROPIUM BROMIDE 0.02 % IN SOLN: 0.5000 mg | Freq: Four times a day (QID) | RESPIRATORY_TRACT | Status: DC | PRN

## 2023-08-14 MED ORDER — POLYETHYLENE GLYCOL 3350 17 G PO PACK: 17.0000 g | PACK | Freq: Every day | ORAL | Status: DC | PRN

## 2023-08-14 NOTE — Care Management Important Message (Signed)
 Important Message  Patient Details  Name: Juan Arnold MRN: [id number] Date of Birth: [birth year]   Important Message Given:  Yes - Medicare IM     [name] [name] 08/14/2023, 4:04 PM

## 2023-08-14 NOTE — Progress Notes (Signed)
 Progress Note  Patient Name: Juan Arnold Date of Encounter: 08/14/2023  Primary Cardiologist: Marsa Dooms, MD  Interval Summary   Interval chart reviewed.  Patient with no reported sense of palpitations and no chest pain.  Continues on treatment for suspected pneumonia.  Heart rate is controlled on IV amiodarone  and oral Toprol -XL.  Vital Signs    Vitals:   08/14/23 0920 08/14/23 0921 08/14/23 0923 08/14/23 0928  BP:      Pulse: (!) 120 (!) 105 (!) 103   Resp: (!) 25 (!) 21 (!) 21   Temp:      TempSrc:      SpO2: (!) 86% 95% (!) 85% 99%  Weight:      Height:        Intake/Output Summary (Last 24 hours) at 08/14/2023 1046 Last data filed at 08/14/2023 0513 Gross per 24 hour  Intake 892.49 ml  Output --  Net 892.49 ml   Filed Weights   08/11/23 1457 08/13/23 0500 08/14/23 0551  Weight: 89.5 kg 90.5 kg 88.1 kg    Physical Exam   GEN: No acute distress.   Neck: No JVD. Cardiac: Irregular irregular, 2/6 apical systolic murmur, no gallop.  Respiratory: Nonlabored. Clear to auscultation bilaterally. GI: Soft, nontender, bowel sounds present. MS: No edema.  ECG/Telemetry    ECG reviewed showing rate controlled atrial fibrillation.  Labs    Chemistry Recent Labs  Lab 08/12/23 0507 08/13/23 0428 08/14/23 0416  NA 139 135 135  K 4.1 4.4 3.8  CL 107 105 103  CO2 21* 21* 21*  GLUCOSE 129* 124* 115*  BUN 38* 48* 50*  CREATININE 2.10* 2.31* 2.14*  CALCIUM  8.3* 8.1* 8.0*  GFRNONAA 30* 27* 29*  ANIONGAP 11 9 11     Hematology Recent Labs  Lab 08/12/23 0507 08/13/23 0428 08/14/23 0416  WBC 9.8 8.1 7.0  RBC 3.42* 3.50* 3.30*  HGB 11.0* 11.0* 10.8*  HCT 36.0* 35.8* 34.3*  MCV 105.3* 102.3* 103.9*  MCH 32.2 31.4 32.7  MCHC 30.6 30.7 31.5  RDW 14.0 13.9 14.1  PLT 171 159 191   Cardiac Enzymes Recent Labs  Lab 08/09/23 1007 08/09/23 1335  TROPONINIHS 25* 23*    Cardiac Studies   Echocardiogram 08/10/2023:  1. Left ventricular ejection  fraction, by estimation, is 40%. The left  ventricle has moderately decreased function. The left ventricle  demonstrates global hypokinesis. The left ventricular internal cavity size  was moderately dilated. There is mild left  ventricular hypertrophy. Left ventricular diastolic parameters are  indeterminate.   2. Right ventricular systolic function is low normal. The right  ventricular size is normal.   3. Left atrial size was severely dilated.   4. Right atrial size was severely dilated.   5. Moderate mitral valve regurgitation.   6. Tricuspid valve regurgitation is severe.   7. The aortic valve is tricuspid. Aortic valve regurgitation is not  visualized. Aortic valve sclerosis is present, with no evidence of aortic  valve stenosis.   8. The inferior vena cava is dilated in size with <50% respiratory  variability, suggesting right atrial pressure of 15 mmHg.   Assessment & Plan   1.  Newly documented atrial fibrillation with RVR in the setting of suspected pneumonia.  CHA2DS2-VASc score is 5.  He is already anticoagulated with Eliquis  for concurrent treatment of left lower extremity DVT.  Presently on Toprol -XL and IV amiodarone  with good heart rate control at this time.   2.  HFrEF, echocardiogram demonstrating LVEF  approximately 40% with global hypokinesis.  Most likely nonischemic etiology and potentially even tachycardia induced depending on overall duration of atrial fibrillation.  Prior echocardiogram through [facility] back in 2002 indicated LVEF 55%.   3.  Moderate mitral regurgitation and severe tricuspid regurgitation.  Echocardiogram from 2002 indicated similar findings, although severe mitral regurgitation at that time. Dr. Ammon notes indicate that mitral regurgitation was much less significant at cardiac catheterization.   4.  Nonobstructive CAD by cardiac catheterization in 2023.  Present cardiac enzymes are minimally elevated and not consistent with ACS.  He does not  report any angina.   5.  CKD stage IIIb, creatinine 2.14 and GFR 29.  No change in cardiac regimen at the present time, he remains on temporary course of IV amiodarone  along with Toprol -XL and Eliquis .  Blood pressure is improving, would hold off on further GDMT at this time.  Was previously on olmesartan  at home, need to reassess renal function trend prior to considering resumption of ARB.  Still not a good candidate for MRA or SGLT2 inhibitor at this point.  May be able to consider stopping amiodarone  and adjusting Toprol -XL if necessary for heart rate control over the weekend.  For questions or updates, please contact [facility] Please consult [email or web address] for contact info under   Signed, Jayson Sierras, MD  08/14/2023, 10:46 AM

## 2023-08-14 NOTE — Plan of Care (Signed)
  Problem: Clinical Measurements: Goal: Respiratory complications will improve Outcome: Progressing Goal: Cardiovascular complication will be avoided Outcome: Progressing   Problem: Pain Managment: Goal: General experience of comfort will improve and/or be controlled Outcome: Progressing   Problem: Activity: Goal: Ability to tolerate increased activity will improve Outcome: Progressing   Problem: Clinical Measurements: Goal: Ability to maintain a body temperature in the normal range will improve Outcome: Progressing   Problem: Respiratory: Goal: Ability to maintain adequate ventilation will improve Outcome: Progressing Goal: Ability to maintain a clear airway will improve Outcome: Progressing   Problem: Education: Goal: Knowledge of disease or condition will improve Outcome: Progressing Goal: Understanding of medication regimen will improve Outcome: Progressing Goal: Individualized Educational Video(s) Outcome: Progressing   Problem: Activity: Goal: Ability to tolerate increased activity will improve Outcome: Progressing   Problem: Cardiac: Goal: Ability to achieve and maintain adequate cardiopulmonary perfusion will improve Outcome: Progressing   Problem: Health Behavior/Discharge Planning: Goal: Ability to safely manage health-related needs after discharge will improve Outcome: Progressing

## 2023-08-14 NOTE — Progress Notes (Signed)
 PROGRESS NOTE    Juan Arnold  FMW:[id number] DOB: 01-Apr-1935 DOA: 08/09/2023 PCP: Lenon Layman ORN, MD   Brief Narrative:  Juan Arnold is a 89 y.o. male with past medical history significant for HTN, history of DVT on Eliquis , CKD stage IIIb, CAD, peptic ulcer disease, history of colon cancer who presented to [facility] ED on 08/09/2023 with progressive shortness of breath over the last week.  Patient reports worse with exertion and associated with fatigue.  Also endorses worsening cough over the last 2 weeks.  Started on Eliquis  for DVT 2-3 months ago and reports compliance.  Remote history of tobacco use, currently inactive.  Denies chest pain, no palpitations, no fever/chills, no nausea/vomiting/diarrhea, no abdominal pain, no urinary symptoms.   In the ED, temperature 98.6 F, HR 112, RR 23, BP 118/90, SpO2 97% on room air.  WBC 8.6, hemoglobin 12.1, platelet count 206.  Sodium 142, potassium 4.5, chloride 111, CO2 22, glucose 138, BUN 34, creat 1.63.  BNP 1193.0.  High sensitive troponin 25>23.  Chest x-ray with small pleural effusion, emphysema.  Patient was given Cardizem  10 mg IV bolus followed by continuous infusion.  VQ scan negative for PE.  TRH consulted for admission for further evaluation management of new onset A-fib with RVR.  Assessment & Plan:   Principal Problem:   Atrial fibrillation with RVR (HCC) Active Problems:   DVT (deep venous thrombosis) (HCC)   Colon cancer (HCC)   CKD (chronic kidney disease) stage 3, GFR 30-59 ml/min (HCC)   HTN (hypertension)   RBBB   Atrial fibrillation with rapid ventricular response (HCC)  New onset paroxysmal atrial fibrillation with RVR, POA: Presented with new diagnosis of atrial fibrillation with rates around 132.  Already on Eliquis  due to history of thromboembolism in the past.  TTE with LVEF 40%, LV moderately decreased function with global hypokinesis, LV moderately dilated, mild LVH, biatrial enlargement, moderate  MR, IVC dilated.  Started on Toprol -XL 100 mg p.o. daily, cardiology consulted, started on amiodarone  IV.  Rates controlled.  Management per cardiology.  Concern for developing hospital-acquired pneumonia Asymptomatic and on room air now.  Continue Rocephin  and doxycycline .   Acute on chronic systolic congestive heart failure, new diagnosis TTE with LVEF 40%, LV moderately decreased function with global hypokinesis, LV moderately dilated, mild LVH, biatrial enlargement, moderate MR, IVC dilated. -- BNP 1193>1136>1172.0>1173.0.  Started on IV Lasix  but that was held starting to 625 due to rising creatinine.  Creatinine has improved now.  Cardiology on board, management per them.   Elevated troponin Troponin elevated initially at 25 followed by 23, patient denies chest pain.  Etiology likely secondary to type II demand ischemia in the setting of A-fib with RVR as above.   History of DVT -- Eliquis  5 mg p.o. twice daily   AKI on CKD stage IIIb -- Cr 1.63>1.72>1.66>2.10>2.31> 2.14 (baseline 1.50 05/18/2023).  Diuretics on hold, defer to cardiology for the timing of resuming. -- Avoid nephrotoxins, renally dose all medications.  Monitor labs daily.   Nonobstructive CAD/hyperlipidemia Underwent left heart catheterization 07/23/2021 with mid LM lesion 40% stenosis, proximal LAD-mid LAD lesion 30% stenosed, proximal RCA-1 lesion 20% stenosed, proximal RCA-2 lesion 20% stenosed, mid RCA lesion 30% stenosis, LVEF 55-65%. -- Crestor  20 mg p.o. daily   Essential hypertension Home regimen includes metoprolol  succinate 25 mg p.o. daily, olmesartan  40 mg p.o. daily. -- Metoprolol  succinate increased to 100 mg p.o. daily as above -- Holding home olmesartan  for now, blood pressure  improving but need a solid stable trend before initiating any other medications. -- Monitor BP   Peptic ulcer disease Currently not on medication outpatient. -- pepcid  PRN  Abdominal distention: Concern for ileus since he is  not passing flatus and has diminished bowel sounds.  Obtain abdominal x-ray, if no ileus, will provide with Dulcolax suppository.  DVT prophylaxis: Eliquis    Code Status: Full Code  Family Communication:  None present at bedside.  Plan of care discussed with patient in length and he/she verbalized understanding and agreed with it.  Status is: Inpatient Remains inpatient appropriate because: still on amio drip   Estimated body mass index is 26.34 kg/m as calculated from the following:   Height as of this encounter: 6' (1.829 m).   Weight as of this encounter: 88.1 kg.    Nutritional Assessment: Body mass index is 26.34 kg/m.[name] Seen by dietician.  I agree with the assessment and plan as outlined below: Nutrition Status:        . Skin Assessment: I have examined the patient's skin and I agree with the wound assessment as performed by the wound care RN as outlined below:    Consultants:  Cardiology  Procedures:  As above  Antimicrobials:  Anti-infectives (From admission, onward)    Start     Dose/Rate Route Frequency Ordered Stop   08/12/23 1800  doxycycline  (VIBRA -TABS) tablet 100 mg        100 mg Oral Every 12 hours 08/12/23 1057 08/16/23 0959   08/11/23 1800  cefTRIAXone  (ROCEPHIN ) 2 g in sodium chloride  0.9 % 100 mL IVPB        2 g 200 mL/hr over 30 Minutes Intravenous Every 24 hours 08/11/23 1712 08/16/23 1759   08/11/23 1800  azithromycin  (ZITHROMAX ) 500 mg in sodium chloride  0.9 % 250 mL IVPB  Status:  Discontinued        500 mg 250 mL/hr over 60 Minutes Intravenous Every 24 hours 08/11/23 1712 08/12/23 1057         Subjective: Patient seen and examined, the only complaint he has is abdominal distention, unable to eat and not passing flatus.  No other complaint.  Denies any chest pain, palpitation or shortness of breath.  Still in atrial fibrillation at the moment.  Objective: Vitals:   08/14/23 0456 08/14/23 0500 08/14/23 0551 08/14/23 0600  BP:  117/62   133/86  Pulse:  73  80  Resp:  14  15  Temp: 98.8 F (37.1 C)     TempSrc: Oral     SpO2:  98%  98%  Weight:   88.1 kg   Height:        Intake/Output Summary (Last 24 hours) at 08/14/2023 0747 Last data filed at 08/14/2023 0513 Gross per 24 hour  Intake 892.49 ml  Output --  Net 892.49 ml   Filed Weights   08/11/23 1457 08/13/23 0500 08/14/23 0551  Weight: 89.5 kg 90.5 kg 88.1 kg    Examination:  General exam: Appears calm and comfortable  Respiratory system: Clear to auscultation. Respiratory effort normal. Cardiovascular system: S1 & S2 heard, irregularly irregular RR. No JVD, murmurs, rubs, gallops or clicks. No pedal edema. Gastrointestinal system: Abdomen is moderately distended, moderately firm and nontender. No organomegaly or masses felt.  Diminished bowel sounds heard. Central nervous system: Alert and oriented. No focal neurological deficits. Extremities: Symmetric 5 x 5 power. Skin: No rashes, lesions or ulcers Psychiatry: Judgement and insight appear normal. Mood & affect appropriate.  Data Reviewed: I have personally reviewed following labs and imaging studies  CBC: Recent Labs  Lab 08/10/23 0530 08/11/23 1752 08/12/23 0507 08/13/23 0428 08/14/23 0416  WBC 8.4 13.4* 9.8 8.1 7.0  HGB 11.3* 11.8* 11.0* 11.0* 10.8*  HCT 36.1* 38.5* 36.0* 35.8* 34.3*  MCV 104.3* 106.9* 105.3* 102.3* 103.9*  PLT 190 205 171 159 191   Basic Metabolic Panel: Recent Labs  Lab 08/09/23 1700 08/10/23 0530 08/11/23 0409 08/12/23 0507 08/13/23 0428 08/14/23 0416  NA  --  142 141 139 135 135  K  --  4.8 4.7 4.1 4.4 3.8  CL  --  112* 112* 107 105 103  CO2  --  21* 21* 21* 21* 21*  GLUCOSE  --  121* 110* 129* 124* 115*  BUN  --  35* 35* 38* 48* 50*  CREATININE  --  1.72* 1.66* 2.10* 2.31* 2.14*  CALCIUM   --  8.6* 8.5* 8.3* 8.1* 8.0*  MG 2.2  --  2.1 2.1  --  2.0   GFR: Estimated Creatinine Clearance: 27.2 mL/min (A) (by C-G formula based on SCr of 2.14 mg/dL  (H)). Liver Function Tests: No results for input(s): AST, ALT, ALKPHOS, BILITOT, PROT, ALBUMIN  in the last 168 hours. No results for input(s): LIPASE, AMYLASE in the last 168 hours. No results for input(s): AMMONIA in the last 168 hours. Coagulation Profile: No results for input(s): INR, PROTIME in the last 168 hours. Cardiac Enzymes: No results for input(s): CKTOTAL, CKMB, CKMBINDEX, TROPONINI in the last 168 hours. BNP (last 3 results) No results for input(s): PROBNP in the last 8760 hours. HbA1C: No results for input(s): HGBA1C in the last 72 hours. CBG: Recent Labs  Lab 08/11/23 1425  GLUCAP 150*   Lipid Profile: No results for input(s): CHOL, HDL, LDLCALC, TRIG, CHOLHDL, LDLDIRECT in the last 72 hours. Thyroid Function Tests: No results for input(s): TSH, T4TOTAL, FREET4, T3FREE, THYROIDAB in the last 72 hours. Anemia Panel: No results for input(s): VITAMINB12, FOLATE, FERRITIN, TIBC, IRON, RETICCTPCT in the last 72 hours. Sepsis Labs: Recent Labs  Lab 08/09/23 1022 08/09/23 1335 08/11/23 1752 08/12/23 0507  PROCALCITON  --   --  0.12 0.11  LATICACIDVEN 1.5 1.3  --   --     Recent Results (from the past 240 hours)  Blood culture (routine x 2)     Status: None   Collection Time: 08/09/23 10:28 AM   Specimen: BLOOD  Result Value Ref Range Status   Specimen Description BLOOD LEFT ANTECUBITAL  Final   Special Requests   Final    BOTTLES DRAWN AEROBIC AND ANAEROBIC Blood Culture results may not be optimal due to an inadequate volume of blood received in culture bottles   Culture   Final    NO GROWTH 5 DAYS Performed at [facility], [address] [place], [place] [address]    Report Status 08/14/2023 FINAL  Final  Blood culture (routine x 2)     Status: None   Collection Time: 08/09/23 11:41 AM   Specimen: BLOOD  Result Value Ref Range Status   Specimen Description BLOOD RIGHT ANTECUBITAL  Final    Special Requests   Final    BOTTLES DRAWN AEROBIC AND ANAEROBIC Blood Culture results may not be optimal due to an inadequate volume of blood received in culture bottles   Culture   Final    NO GROWTH 5 DAYS Performed at [facility], [address] [place], [place] [address]    Report Status 08/14/2023  FINAL  Final  Resp panel by RT-PCR (RSV, Flu A&B, Covid) Anterior Nasal Swab     Status: None   Collection Time: 08/09/23 12:37 PM   Specimen: Anterior Nasal Swab  Result Value Ref Range Status   SARS Coronavirus 2 by RT PCR NEGATIVE NEGATIVE Final    Comment: (NOTE) SARS-CoV-2 target nucleic acids are NOT DETECTED.  The SARS-CoV-2 RNA is generally detectable in upper respiratory specimens during the acute phase of infection. The lowest concentration of SARS-CoV-2 viral copies this assay can detect is 138 copies/mL. A negative result does not preclude SARS-Cov-2 infection and should not be used as the sole basis for treatment or other patient management decisions. A negative result may occur with  improper specimen collection/handling, submission of specimen other than nasopharyngeal swab, presence of viral mutation(s) within the areas targeted by this assay, and inadequate number of viral copies(<138 copies/mL). A negative result must be combined with clinical observations, patient history, and epidemiological information. The expected result is Negative.  Fact Sheet for Patients:  bloggercourse.com  Fact Sheet for Healthcare Providers:  seriousbroker.it  This test is no t yet approved or cleared by the [place]  FDA and  has been authorized for detection and/or diagnosis of SARS-CoV-2 by FDA under an Emergency Use Authorization (EUA). This EUA will remain  in effect (meaning this test can be used) for the duration of the COVID-19 declaration under Section 564(b)(1) of the Act, 21 U.S.C.section 360bbb-3(b)(1), unless the  authorization is terminated  or revoked sooner.       Influenza A by PCR NEGATIVE NEGATIVE Final   Influenza B by PCR NEGATIVE NEGATIVE Final    Comment: (NOTE) The Xpert Xpress SARS-CoV-2/FLU/RSV plus assay is intended as an aid in the diagnosis of influenza from Nasopharyngeal swab specimens and should not be used as a sole basis for treatment. Nasal washings and aspirates are unacceptable for Xpert Xpress SARS-CoV-2/FLU/RSV testing.  Fact Sheet for Patients: bloggercourse.com  Fact Sheet for Healthcare Providers: seriousbroker.it  This test is not yet approved or cleared by the [place]  FDA and has been authorized for detection and/or diagnosis of SARS-CoV-2 by FDA under an Emergency Use Authorization (EUA). This EUA will remain in effect (meaning this test can be used) for the duration of the COVID-19 declaration under Section 564(b)(1) of the Act, 21 U.S.C. section 360bbb-3(b)(1), unless the authorization is terminated or revoked.     Resp Syncytial Virus by PCR NEGATIVE NEGATIVE Final    Comment: (NOTE) Fact Sheet for Patients: bloggercourse.com  Fact Sheet for Healthcare Providers: seriousbroker.it  This test is not yet approved or cleared by the [place]  FDA and has been authorized for detection and/or diagnosis of SARS-CoV-2 by FDA under an Emergency Use Authorization (EUA). This EUA will remain in effect (meaning this test can be used) for the duration of the COVID-19 declaration under Section 564(b)(1) of the Act, 21 U.S.C. section 360bbb-3(b)(1), unless the authorization is terminated or revoked.  Performed at [facility], [address], [place], [place] [address]   MRSA Next Gen by PCR, Nasal     Status: None   Collection Time: 08/11/23  2:52 PM   Specimen: Nasal Mucosa; Nasal Swab  Result Value Ref Range Status   MRSA by PCR Next Gen NOT  DETECTED NOT DETECTED Final    Comment: (NOTE) The GeneXpert MRSA Assay (FDA approved for NASAL specimens only), is one component of a comprehensive MRSA colonization surveillance program. It is not intended to diagnose MRSA infection  nor to guide or monitor treatment for MRSA infections. Test performance is not FDA approved in patients less than 5 years old. Performed at [facility], [address] [place], [place] [address]      Radiology Studies: No results found.  Scheduled Meds:  apixaban   5 mg Oral BID   Chlorhexidine  Gluconate Cloth  6 each Topical Daily   doxycycline   100 mg Oral Q12H   ipratropium  0.5 mg Nebulization Q6H   levalbuterol   0.63 mg Nebulization Q6H   metoprolol  succinate  100 mg Oral Daily   pantoprazole   40 mg Oral Daily   rosuvastatin   20 mg Oral QHS   Continuous Infusions:  amiodarone  30 mg/hr (08/14/23 0626)   cefTRIAXone  (ROCEPHIN )  IV 2 g (08/13/23 1740)     LOS: 5 days   Fredia Skeeter, MD [facility]  08/14/2023, 7:47 AM   *Please note that this is a verbal dictation therefore any spelling or grammatical errors are due to the Dragon Medical One system interpretation.  Please page via Amion and do not message via secure chat for urgent patient care matters. Secure chat can be used for non urgent patient care matters.  How to contact the [facility] Attending or Consulting provider 7A - 7P or covering provider during after hours 7P -7A, for this patient?  Check the care team in [facility] and look for a) attending/consulting TR[facility] provider listed and b) the TRH team listed. Page or secure chat 7A-7P. Log into [email or web address] and use [place]'s universal password to access. If you do not have the password, please contact the hospital operator. Locate the [facility] provider you are looking for under [facility] and page to a number that you can be directly reached. If you still have difficulty reaching the provider, please page the [facility] (Director on Call)  for the Hospitalists listed on amion for assistance.

## 2023-08-15 DIAGNOSIS — I4891 Unspecified atrial fibrillation: Secondary | ICD-10-CM | POA: Diagnosis not present

## 2023-08-15 LAB — BASIC METABOLIC PANEL
Anion gap: 11 (ref 5–15)
BUN: 52 mg/dL — ABNORMAL HIGH (ref 8–23)
CO2: 22 mmol/L (ref 22–32)
Calcium: 8.2 mg/dL — ABNORMAL LOW (ref 8.9–10.3)
Chloride: 103 mmol/L (ref 98–111)
Creatinine, Ser: 2.18 mg/dL — ABNORMAL HIGH (ref 0.61–1.24)
GFR, Estimated: 29 mL/min — ABNORMAL LOW (ref >60)
Glucose, Bld: 128 mg/dL — ABNORMAL HIGH (ref 70–99)
Potassium: 4 mmol/L (ref 3.5–5.1)
Sodium: 136 mmol/L (ref 135–145)

## 2023-08-15 MED ORDER — AMIODARONE HCL 200 MG PO TABS
200.0000 mg | ORAL_TABLET | Freq: Two times a day (BID) | ORAL | Status: DC
  Administered 2023-08-15 – 2023-08-17 (×5): 200 mg via ORAL
  Filled 2023-08-15 (×5): qty 1

## 2023-08-15 NOTE — Progress Notes (Signed)
 PROGRESS NOTE    Juan Arnold  FMW:[id number] DOB: 24-Jul-1934 DOA: 08/09/2023 PCP: Lenon Layman ORN, MD   Brief Narrative:  Juan Arnold is a 89 y.o. male with past medical history significant for HTN, history of DVT on Eliquis , CKD stage IIIb, CAD, peptic ulcer disease, history of colon cancer who presented to [facility] ED on 08/09/2023 with progressive shortness of breath over the last week.  Patient reports worse with exertion and associated with fatigue.  Also endorses worsening cough over the last 2 weeks.  Started on Eliquis  for DVT 2-3 months ago and reports compliance.  Remote history of tobacco use, currently inactive.  Denies chest pain, no palpitations, no fever/chills, no nausea/vomiting/diarrhea, no abdominal pain, no urinary symptoms.   In the ED, temperature 98.6 F, HR 112, RR 23, BP 118/90, SpO2 97% on room air.  WBC 8.6, hemoglobin 12.1, platelet count 206.  Sodium 142, potassium 4.5, chloride 111, CO2 22, glucose 138, BUN 34, creat 1.63.  BNP 1193.0.  High sensitive troponin 25>23.  Chest x-ray with small pleural effusion, emphysema.  Patient was given Cardizem  10 mg IV bolus followed by continuous infusion.  VQ scan negative for PE.  TRH consulted for admission for further evaluation management of new onset A-fib with RVR.  Assessment & Plan:   Principal Problem:   Atrial fibrillation with RVR (HCC) Active Problems:   DVT (deep venous thrombosis) (HCC)   Colon cancer (HCC)   CKD (chronic kidney disease) stage 3, GFR 30-59 ml/min (HCC)   HTN (hypertension)   RBBB   Atrial fibrillation with rapid ventricular response (HCC)  New onset paroxysmal atrial fibrillation with RVR, POA: Presented with new diagnosis of atrial fibrillation with rates around 132.  Already on Eliquis  due to history of thromboembolism in the past.  TTE with LVEF 40%, LV moderately decreased function with global hypokinesis, LV moderately dilated, mild LVH, biatrial enlargement, moderate  MR, IVC dilated.  Started on Toprol -XL 100 mg p.o. daily, cardiology consulted, started on amiodarone  IV.  Rates controlled.  Patient is still on amiodarone , discussed with cardiology on-call Dr. Mallipedi and she recommended stopping IV amiodarone  and transitioning to amiodarone  200 mg p.o. twice daily.  Concern for developing hospital-acquired pneumonia Asymptomatic and on room air now.  Continue Rocephin  and doxycycline .   Acute hypoxic respiratory failure secondary to acute on chronic systolic congestive heart failure, new diagnosis TTE with LVEF 40%, LV moderately decreased function with global hypokinesis, LV moderately dilated, mild LVH, biatrial enlargement, moderate MR, IVC dilated. -- BNP 1193>1136>1172.0>1173.0.  Started on IV Lasix  but that was held starting 08/13/23 due to rising creatinine.  Creatinine improved somewhat yesterday, no clear recommendations from cardiology about when to resume Lasix .  I am getting another BMP today.  Will further decide after that.  He is currently on 3 L of oxygen.  Saturating 97%.  Will wean as much as we can. Following is the cardiology note on 08/14/2023. Blood pressure is improving, would hold off on further GDMT at this time. Was previously on olmesartan  at home, need to reassess renal function trend prior to considering resumption of ARB. Still not a good candidate for MRA or SGLT2 inhibitor at this point.    Elevated troponin Troponin elevated initially at 25 followed by 23, patient denies chest pain.  Etiology likely secondary to type II demand ischemia in the setting of A-fib with RVR as above.   History of DVT -- Eliquis  5 mg p.o. twice daily  AKI on CKD stage IIIb -- Cr 1.63>1.72>1.66>2.10>2.31> 2.14 (baseline 1.50 05/18/2023).  Diuretics on hold, defer to cardiology for the timing of resuming. -- Avoid nephrotoxins, renally dose all medications.  BMP pending today.   Nonobstructive CAD/hyperlipidemia Underwent left heart catheterization  07/23/2021 with mid LM lesion 40% stenosis, proximal LAD-mid LAD lesion 30% stenosed, proximal RCA-1 lesion 20% stenosed, proximal RCA-2 lesion 20% stenosed, mid RCA lesion 30% stenosis, LVEF 55-65%. -- Crestor  20 mg p.o. daily   Essential hypertension Home regimen includes metoprolol  succinate 25 mg p.o. daily, olmesartan  40 mg p.o. daily. -- Metoprolol  succinate increased to 100 mg p.o. daily as above -- Holding home olmesartan  for now, blood pressure improving but need a solid stable trend before initiating any other medications. -- Monitor BP   Peptic ulcer disease Currently not on medication outpatient. -- pepcid  PRN  Abdominal distention/ileus: Patient was complaining of abdominal distention yesterday, abdominal x-ray obtained which showed ileus,  patient has had 4 bowel movements and abdomen is soft nontender today.  Clinically, ileus appears to have resolved.  DVT prophylaxis: Eliquis    Code Status: Full Code  Family Communication:  None present at bedside.  Plan of care discussed with patient in length and he/she verbalized understanding and agreed with it.  Status is: Inpatient Remains inpatient appropriate because: Needs inpatient management.   Estimated body mass index is 26.16 kg/m as calculated from the following:   Height as of this encounter: 6' (1.829 m).   Weight as of this encounter: 87.5 kg.    Nutritional Assessment: Body mass index is 26.16 kg/m.[name] Seen by dietician.  I agree with the assessment and plan as outlined below: Nutrition Status:        . Skin Assessment: I have examined the patient's skin and I agree with the wound assessment as performed by the wound care RN as outlined below:    Consultants:  Cardiology  Procedures:  As above  Antimicrobials:  Anti-infectives (From admission, onward)    Start     Dose/Rate Route Frequency Ordered Stop   08/12/23 1800  doxycycline  (VIBRA -TABS) tablet 100 mg        100 mg Oral Every 12 hours  08/12/23 1057 08/16/23 0959   08/11/23 1800  cefTRIAXone  (ROCEPHIN ) 2 g in sodium chloride  0.9 % 100 mL IVPB        2 g 200 mL/hr over 30 Minutes Intravenous Every 24 hours 08/11/23 1712 08/16/23 1759   08/11/23 1800  azithromycin  (ZITHROMAX ) 500 mg in sodium chloride  0.9 % 250 mL IVPB  Status:  Discontinued        500 mg 250 mL/hr over 60 Minutes Intravenous Every 24 hours 08/11/23 1712 08/12/23 1057         Subjective: Patient seen and examined.  He is feeling better, happy that his abdomen is not as tight as it was yesterday and he is having frequent bowel movements.  No other complaint.  Objective: Vitals:   08/15/23 0600 08/15/23 0700 08/15/23 0800 08/15/23 0900  BP: 121/70 116/65 123/67 118/62  Pulse: 72 88 92 82  Resp: 19 13 (!) 26 18  Temp:   97.8 F (36.6 C)   TempSrc:   Oral   SpO2: 94% 96% 98% 97%  Weight:      Height:        Intake/Output Summary (Last 24 hours) at 08/15/2023 1018 Last data filed at 08/15/2023 1010 Gross per 24 hour  Intake 678.47 ml  Output --  Net 678.47 ml  Filed Weights   08/13/23 0500 08/14/23 0551 08/15/23 0500  Weight: 90.5 kg 88.1 kg 87.5 kg    Examination:  General exam: Appears calm and comfortable  Respiratory system: Clear to auscultation. Respiratory effort normal. Cardiovascular system: S1 & S2 heard, RRR. No JVD, murmurs, rubs, gallops or clicks. No pedal edema. Gastrointestinal system: Abdomen is nondistended, soft and nontender. No organomegaly or masses felt. Normal bowel sounds heard. Central nervous system: Alert and oriented. No focal neurological deficits. Extremities: Symmetric 5 x 5 power. Skin: No rashes, lesions or ulcers.  Psychiatry: Judgement and insight appear normal. Mood & affect appropriate.   Data Reviewed: I have personally reviewed following labs and imaging studies  CBC: Recent Labs  Lab 08/10/23 0530 08/11/23 1752 08/12/23 0507 08/13/23 0428 08/14/23 0416  WBC 8.4 13.4* 9.8 8.1 7.0  HGB  11.3* 11.8* 11.0* 11.0* 10.8*  HCT 36.1* 38.5* 36.0* 35.8* 34.3*  MCV 104.3* 106.9* 105.3* 102.3* 103.9*  PLT 190 205 171 159 191   Basic Metabolic Panel: Recent Labs  Lab 08/09/23 1700 08/10/23 0530 08/11/23 0409 08/12/23 0507 08/13/23 0428 08/14/23 0416  NA  --  142 141 139 135 135  K  --  4.8 4.7 4.1 4.4 3.8  CL  --  112* 112* 107 105 103  CO2  --  21* 21* 21* 21* 21*  GLUCOSE  --  121* 110* 129* 124* 115*  BUN  --  35* 35* 38* 48* 50*  CREATININE  --  1.72* 1.66* 2.10* 2.31* 2.14*  CALCIUM   --  8.6* 8.5* 8.3* 8.1* 8.0*  MG 2.2  --  2.1 2.1  --  2.0   GFR: Estimated Creatinine Clearance: 27.2 mL/min (A) (by C-G formula based on SCr of 2.14 mg/dL (H)). Liver Function Tests: No results for input(s): AST, ALT, ALKPHOS, BILITOT, PROT, ALBUMIN  in the last 168 hours. No results for input(s): LIPASE, AMYLASE in the last 168 hours. No results for input(s): AMMONIA in the last 168 hours. Coagulation Profile: No results for input(s): INR, PROTIME in the last 168 hours. Cardiac Enzymes: No results for input(s): CKTOTAL, CKMB, CKMBINDEX, TROPONINI in the last 168 hours. BNP (last 3 results) No results for input(s): PROBNP in the last 8760 hours. HbA1C: No results for input(s): HGBA1C in the last 72 hours. CBG: Recent Labs  Lab 08/11/23 1425  GLUCAP 150*   Lipid Profile: No results for input(s): CHOL, HDL, LDLCALC, TRIG, CHOLHDL, LDLDIRECT in the last 72 hours. Thyroid Function Tests: No results for input(s): TSH, T4TOTAL, FREET4, T3FREE, THYROIDAB in the last 72 hours. Anemia Panel: No results for input(s): VITAMINB12, FOLATE, FERRITIN, TIBC, IRON, RETICCTPCT in the last 72 hours. Sepsis Labs: Recent Labs  Lab 08/09/23 1022 08/09/23 1335 08/11/23 1752 08/12/23 0507  PROCALCITON  --   --  0.12 0.11  LATICACIDVEN 1.5 1.3  --   --     Recent Results (from the past 240 hours)  Blood culture (routine x  2)     Status: None   Collection Time: 08/09/23 10:28 AM   Specimen: BLOOD  Result Value Ref Range Status   Specimen Description BLOOD LEFT ANTECUBITAL  Final   Special Requests   Final    BOTTLES DRAWN AEROBIC AND ANAEROBIC Blood Culture results may not be optimal due to an inadequate volume of blood received in culture bottles   Culture   Final    NO GROWTH 5 DAYS Performed at [facility], [address], [place], [place] [address]  Report Status 08/14/2023 FINAL  Final  Blood culture (routine x 2)     Status: None   Collection Time: 08/09/23 11:41 AM   Specimen: BLOOD  Result Value Ref Range Status   Specimen Description BLOOD RIGHT ANTECUBITAL  Final   Special Requests   Final    BOTTLES DRAWN AEROBIC AND ANAEROBIC Blood Culture results may not be optimal due to an inadequate volume of blood received in culture bottles   Culture   Final    NO GROWTH 5 DAYS Performed at [facility], [address], [place], [place] [address]    Report Status 08/14/2023 FINAL  Final  Resp panel by RT-PCR (RSV, Flu A&B, Covid) Anterior Nasal Swab     Status: None   Collection Time: 08/09/23 12:37 PM   Specimen: Anterior Nasal Swab  Result Value Ref Range Status   SARS Coronavirus 2 by RT PCR NEGATIVE NEGATIVE Final    Comment: (NOTE) SARS-CoV-2 target nucleic acids are NOT DETECTED.  The SARS-CoV-2 RNA is generally detectable in upper respiratory specimens during the acute phase of infection. The lowest concentration of SARS-CoV-2 viral copies this assay can detect is 138 copies/mL. A negative result does not preclude SARS-Cov-2 infection and should not be used as the sole basis for treatment or other patient management decisions. A negative result may occur with  improper specimen collection/handling, submission of specimen other than nasopharyngeal swab, presence of viral mutation(s) within the areas targeted by this assay, and inadequate number of viral copies(<138 copies/mL). A  negative result must be combined with clinical observations, patient history, and epidemiological information. The expected result is Negative.  Fact Sheet for Patients:  bloggercourse.com  Fact Sheet for Healthcare Providers:  seriousbroker.it  This test is no t yet approved or cleared by the [place]  FDA and  has been authorized for detection and/or diagnosis of SARS-CoV-2 by FDA under an Emergency Use Authorization (EUA). This EUA will remain  in effect (meaning this test can be used) for the duration of the COVID-19 declaration under Section 564(b)(1) of the Act, 21 U.S.C.section 360bbb-3(b)(1), unless the authorization is terminated  or revoked sooner.       Influenza A by PCR NEGATIVE NEGATIVE Final   Influenza B by PCR NEGATIVE NEGATIVE Final    Comment: (NOTE) The Xpert Xpress SARS-CoV-2/FLU/RSV plus assay is intended as an aid in the diagnosis of influenza from Nasopharyngeal swab specimens and should not be used as a sole basis for treatment. Nasal washings and aspirates are unacceptable for Xpert Xpress SARS-CoV-2/FLU/RSV testing.  Fact Sheet for Patients: bloggercourse.com  Fact Sheet for Healthcare Providers: seriousbroker.it  This test is not yet approved or cleared by the [place]  FDA and has been authorized for detection and/or diagnosis of SARS-CoV-2 by FDA under an Emergency Use Authorization (EUA). This EUA will remain in effect (meaning this test can be used) for the duration of the COVID-19 declaration under Section 564(b)(1) of the Act, 21 U.S.C. section 360bbb-3(b)(1), unless the authorization is terminated or revoked.     Resp Syncytial Virus by PCR NEGATIVE NEGATIVE Final    Comment: (NOTE) Fact Sheet for Patients: bloggercourse.com  Fact Sheet for Healthcare  Providers: seriousbroker.it  This test is not yet approved or cleared by the [place]  FDA and has been authorized for detection and/or diagnosis of SARS-CoV-2 by FDA under an Emergency Use Authorization (EUA). This EUA will remain in effect (meaning this test can be used) for the duration of the COVID-19 declaration under Section  564(b)(1) of the Act, 21 U.S.C. section 360bbb-3(b)(1), unless the authorization is terminated or revoked.  Performed at [facility], [address], [place], [place] [address]   MRSA Next Gen by PCR, Nasal     Status: None   Collection Time: 08/11/23  2:52 PM   Specimen: Nasal Mucosa; Nasal Swab  Result Value Ref Range Status   MRSA by PCR Next Gen NOT DETECTED NOT DETECTED Final    Comment: (NOTE) The GeneXpert MRSA Assay (FDA approved for NASAL specimens only), is one component of a comprehensive MRSA colonization surveillance program. It is not intended to diagnose MRSA infection nor to guide or monitor treatment for MRSA infections. Test performance is not FDA approved in patients less than 18 years old. Performed at [facility], [address] [place], [place] [address]      Radiology Studies: DG Abd 1 View Result Date: 08/14/2023 CLINICAL DATA:  Ileus, abdominal pain EXAM: ABDOMEN - 1 VIEW COMPARISON:  11/26/2022 FINDINGS: Two supine frontal views of the abdomen and pelvis are obtained. There is diffuse gaseous distension of the large and small bowel compatible with given history of ileus. No masses or abnormal calcifications. Lung bases are clear. No acute bony abnormalities. IMPRESSION: 1. Diffuse gaseous distension of the large and small bowel, compatible with given history of ileus. Electronically Signed   By: Ozell Daring M.D.   On: 08/14/2023 14:45    Scheduled Meds:  amiodarone   200 mg Oral BID   apixaban   5 mg Oral BID   Chlorhexidine  Gluconate Cloth  6 each Topical Daily   docusate sodium   100 mg Oral Daily    doxycycline   100 mg Oral Q12H   metoprolol  succinate  100 mg Oral Daily   pantoprazole   40 mg Oral Daily   rosuvastatin   20 mg Oral QHS   Continuous Infusions:  cefTRIAXone  (ROCEPHIN )  IV 2 g (08/14/23 2005)     LOS: 6 days   Fredia Skeeter, MD [facility]  08/15/2023, 10:18 AM   *Please note that this is a verbal dictation therefore any spelling or grammatical errors are due to the Dragon Medical One system interpretation.  Please page via Amion and do not message via secure chat for urgent patient care matters. Secure chat can be used for non urgent patient care matters.  How to contact the [facility] Attending or Consulting provider 7A - 7P or covering provider during after hours 7P -7A, for this patient?  Check the care team in [facility] and look for a) attending/consulting TRH provider listed and b) the TRH team listed. Page or secure chat 7A-7P. Log into [email or web address] and use [facility] universal password to access. If you do not have the password, please contact the hospital operator. Locate the [facility] provider you are looking for under [facility] and page to a number that you can be directly reached. If you still have difficulty reaching the provider, please page the [facility] (Director on Call) for the Hospitalists listed on amion for assistance.

## 2023-08-15 NOTE — Plan of Care (Signed)
  Problem: Clinical Measurements: Goal: Respiratory complications will improve Outcome: Progressing Goal: Cardiovascular complication will be avoided Outcome: Progressing   Problem: Pain Managment: Goal: General experience of comfort will improve and/or be controlled Outcome: Progressing   Problem: Activity: Goal: Ability to tolerate increased activity will improve Outcome: Progressing   Problem: Clinical Measurements: Goal: Ability to maintain a body temperature in the normal range will improve Outcome: Progressing   Problem: Respiratory: Goal: Ability to maintain adequate ventilation will improve Outcome: Progressing Goal: Ability to maintain a clear airway will improve Outcome: Progressing   Problem: Education: Goal: Knowledge of disease or condition will improve Outcome: Progressing Goal: Understanding of medication regimen will improve Outcome: Progressing Goal: Individualized Educational Video(s) Outcome: Progressing   Problem: Activity: Goal: Ability to tolerate increased activity will improve Outcome: Progressing   Problem: Cardiac: Goal: Ability to achieve and maintain adequate cardiopulmonary perfusion will improve Outcome: Progressing   Problem: Health Behavior/Discharge Planning: Goal: Ability to safely manage health-related needs after discharge will improve Outcome: Progressing

## 2023-08-15 NOTE — Evaluation (Signed)
 Physical Therapy Evaluation Patient Details Name: Juan Arnold MRN: [id number] DOB: April 28, 1935 Today's Date: 08/15/2023  History of Present Illness  Per MD at admission:  Juan Arnold is a 89 y.o. male with medical history significant for hypertension, DVT, peptic ulcer disease, colon cancer, CKD 3, coronary artery disease.  Patient presented to the ED with complaints of difficulty breathing worse with exertion and improves with rest, that started about a week ago, also with accompanying fatigue.  Over the past 3 days symptoms have significantly worsened.  Also reports of worsening cough over the past 2 weeks.  No lower extremity swelling, no chest pain.  No palpitations.  No history of atrial fibrillation.  He was started on Eliquis  for DVT about 2 to 3 months ago and he reports compliance.  Reports remote smoking history.     Clinical Impression  PT at or near prior level of function does not need PT         If plan is discharge home, recommend the following:  none   Can travel by private vehicle    yes    Equipment Recommendations None recommended by PT  Recommendations for Other Services   none    Functional Status Assessment Patient has had a recent decline in their functional status and demonstrates the ability to make significant improvements in function in a reasonable and predictable amount of time.     Precautions / Restrictions Precautions Precautions: None Restrictions Weight Bearing Restrictions Per Provider Order: No      Mobility  Bed Mobility Overal bed mobility: Independent                  Transfers Overall transfer level: Independent                      Ambulation/Gait Ambulation/Gait assistance: Independent Gait Distance (Feet): 150 Feet Assistive device: None Gait Pattern/deviations: WFL(Within Functional Limits)       General Gait Details: O2 at end of ambulation 97; HR 83         Pertinent Vitals/Pain Pain  Assessment Pain Assessment: No/denies pain    Home Living Family/patient expects to be discharged to:: Private residence   Available Help at Discharge: Family Type of Home: House Home Access: Stairs to enter   Secretary/administrator of Steps: 3   Home Layout: One level Home Equipment: None      Prior Function Prior Level of Function : Independent/Modified Independent                     Extremity/Trunk Assessment        Lower Extremity Assessment Lower Extremity Assessment: Overall WFL for tasks assessed       Communication   Communication Communication: No apparent difficulties  Cognition Arousal: Alert   Overall Cognitive Status: Within Functional Limits for tasks assessed                                                 Assessment/Plan    PT Assessment Patient does not need any further PT services               AM-PAC PT 6 Clicks Mobility  Outcome Measure Help needed turning from your back to your side while in a flat bed without using bedrails?: None Help needed moving from lying  on your back to sitting on the side of a flat bed without using bedrails?: None Help needed moving to and from a bed to a chair (including a wheelchair)?: None Help needed standing up from a chair using your arms (e.g., wheelchair or bedside chair)?: None Help needed to walk in hospital room?: None Help needed climbing 3-5 steps with a railing? : None 6 Click Score: 24    End of Session   Activity Tolerance: Patient tolerated treatment well Patient left: in chair;with call bell/phone within reach Nurse Communication: Mobility status PT Visit Diagnosis: Other (comment) (decreased activity tolerance)    Time: 8968-8948 PT Time Calculation (min) (ACUTE ONLY): 20 min   Charges:   PT Evaluation $PT Eval Low Complexity: 1 Low   PT General Charges $$ ACUTE PT VISIT: 1 Visit       Montie Metro, PT CLT [phone]  08/15/2023, 10:51 AM

## 2023-08-16 DIAGNOSIS — I4891 Unspecified atrial fibrillation: Secondary | ICD-10-CM | POA: Diagnosis not present

## 2023-08-16 LAB — BASIC METABOLIC PANEL
Anion gap: 10 (ref 5–15)
BUN: 54 mg/dL — ABNORMAL HIGH (ref 8–23)
CO2: 23 mmol/L (ref 22–32)
Calcium: 8.2 mg/dL — ABNORMAL LOW (ref 8.9–10.3)
Chloride: 104 mmol/L (ref 98–111)
Creatinine, Ser: 2.38 mg/dL — ABNORMAL HIGH (ref 0.61–1.24)
GFR, Estimated: 26 mL/min — ABNORMAL LOW (ref >60)
Glucose, Bld: 167 mg/dL — ABNORMAL HIGH (ref 70–99)
Potassium: 3.4 mmol/L — ABNORMAL LOW (ref 3.5–5.1)
Sodium: 137 mmol/L (ref 135–145)

## 2023-08-16 MED ORDER — POTASSIUM CHLORIDE CRYS ER 20 MEQ PO TBCR
40.0000 meq | EXTENDED_RELEASE_TABLET | Freq: Once | ORAL | Status: AC
  Administered 2023-08-16: 40 meq via ORAL
  Filled 2023-08-16: qty 2

## 2023-08-16 NOTE — Progress Notes (Addendum)
 PROGRESS NOTE    Juan Arnold  FMW:[id number] DOB: February 09, 1935 DOA: 08/09/2023 PCP: Lenon Layman ORN, MD   Brief Narrative:  Juan Arnold is a 89 y.o. male with past medical history significant for HTN, history of DVT on Eliquis , CKD stage IIIb, CAD, peptic ulcer disease, history of colon cancer who presented to [facility] ED on 08/09/2023 with progressive shortness of breath over the last week.  Patient reports worse with exertion and associated with fatigue.  Also endorses worsening cough over the last 2 weeks.  Started on Eliquis  for DVT 2-3 months ago and reports compliance.  Remote history of tobacco use, currently inactive.  Denies chest pain, no palpitations, no fever/chills, no nausea/vomiting/diarrhea, no abdominal pain, no urinary symptoms.   In the ED, temperature 98.6 F, HR 112, RR 23, BP 118/90, SpO2 97% on room air.  WBC 8.6, hemoglobin 12.1, platelet count 206.  Sodium 142, potassium 4.5, chloride 111, CO2 22, glucose 138, BUN 34, creat 1.63.  BNP 1193.0.  High sensitive troponin 25>23.  Chest x-ray with small pleural effusion, emphysema.  Patient was given Cardizem  10 mg IV bolus followed by continuous infusion.  VQ scan negative for PE.  TRH consulted for admission for further evaluation management of new onset A-fib with RVR.  Assessment & Plan:   Principal Problem:   Atrial fibrillation with RVR (HCC) Active Problems:   DVT (deep venous thrombosis) (HCC)   Colon cancer (HCC)   CKD (chronic kidney disease) stage 3, GFR 30-59 ml/min (HCC)   HTN (hypertension)   RBBB   Atrial fibrillation with rapid ventricular response (HCC)  New onset paroxysmal atrial fibrillation with RVR, POA: Presented with new diagnosis of atrial fibrillation with rates around 132.  Already on Eliquis  due to history of thromboembolism in the past.  TTE with LVEF 40%, LV moderately decreased function with global hypokinesis, LV moderately dilated, mild LVH, biatrial enlargement, moderate  MR, IVC dilated.  Started on Toprol -XL 100 mg p.o. daily, cardiology consulted, started on amiodarone  IV.  Rates controlled.  Patient is still on amiodarone , discussed with cardiology on-call Dr. Mallipedi 08/15/2023 via secure chat and she recommended stopping IV amiodarone  and transitioning to amiodarone  200 mg p.o. twice daily.  Concern for developing hospital-acquired pneumonia Asymptomatic and on room air now.  Continue Rocephin  and doxycycline .   Acute hypoxic respiratory failure secondary to acute on chronic systolic congestive heart failure, new diagnosis TTE with LVEF 40%, LV moderately decreased function with global hypokinesis, LV moderately dilated, mild LVH, biatrial enlargement, moderate MR, IVC dilated. -- BNP 1193>1136>1172.0>1173.0.  Started on IV Lasix  but that was held starting 08/13/23 due to rising creatinine.  Creatinine improved somewhat but then worse today still restricting from resuming diuretics and GDMT.  Fortunately, patient is on room air and saturating 97% today. Following is the cardiology note on 08/14/2023. Blood pressure is improving, would hold off on further GDMT at this time. Was previously on olmesartan  at home, need to reassess renal function trend prior to considering resumption of ARB. Still not a good candidate for MRA or SGLT2 inhibitor at this point.    Elevated troponin Troponin elevated initially at 25 followed by 23, patient denies chest pain.  Etiology likely secondary to type II demand ischemia in the setting of A-fib with RVR as above.   History of DVT -- Eliquis  5 mg p.o. twice daily   AKI on CKD stage IIIb -- Cr 1.63>1.72>1.66>2.10>2.31> 2.14> 2.18> 2.38 (baseline 1.50 05/18/2023).  Diuretics on hold,  defer to cardiology for the timing of resuming.  Creatinine rising today. -- Avoid nephrotoxins, renally dose all medications.   Hypokalemia: Replenished.   Nonobstructive CAD/hyperlipidemia Underwent left heart catheterization 07/23/2021 with mid LM  lesion 40% stenosis, proximal LAD-mid LAD lesion 30% stenosed, proximal RCA-1 lesion 20% stenosed, proximal RCA-2 lesion 20% stenosed, mid RCA lesion 30% stenosis, LVEF 55-65%. -- Crestor  20 mg p.o. daily   Essential hypertension Home regimen includes metoprolol  succinate 25 mg p.o. daily, olmesartan  40 mg p.o. daily. -- Metoprolol  succinate increased to 100 mg p.o. daily as above -- Holding home olmesartan  for now, blood pressure improving but need a solid stable trend before initiating any other medications. -- Monitor BP   Peptic ulcer disease Currently not on medication outpatient. -- pepcid  PRN  Abdominal distention/ileus: Patient was complaining of abdominal distention 08/14/2023, abdominal x-ray obtained which showed ileus,  patient has had multiple bowel movements since then, tolerating diet, abdomen is benign.  DVT prophylaxis: Eliquis    Code Status: Full Code  Family Communication:  None present at bedside.  Plan of care discussed with patient in length and he/she verbalized understanding and agreed with it.  Status is: Inpatient Remains inpatient appropriate because: Needs inpatient management due to rising creatinine   Estimated body mass index is 26.28 kg/m as calculated from the following:   Height as of this encounter: 6' (1.829 m).   Weight as of this encounter: 87.9 kg.    Nutritional Assessment: Body mass index is 26.28 kg/m.[name] Seen by dietician.  I agree with the assessment and plan as outlined below: Nutrition Status:        . Skin Assessment: I have examined the patient's skin and I agree with the wound assessment as performed by the wound care RN as outlined below:    Consultants:  Cardiology  Procedures:  As above  Antimicrobials:  Anti-infectives (From admission, onward)    Start     Dose/Rate Route Frequency Ordered Stop   08/12/23 1800  doxycycline  (VIBRA -TABS) tablet 100 mg        100 mg Oral Every 12 hours 08/12/23 1057 08/15/23 2133    08/11/23 1800  cefTRIAXone  (ROCEPHIN ) 2 g in sodium chloride  0.9 % 100 mL IVPB        2 g 200 mL/hr over 30 Minutes Intravenous Every 24 hours 08/11/23 1712 08/15/23 1648   08/11/23 1800  azithromycin  (ZITHROMAX ) 500 mg in sodium chloride  0.9 % 250 mL IVPB  Status:  Discontinued        500 mg 250 mL/hr over 60 Minutes Intravenous Every 24 hours 08/11/23 1712 08/12/23 1057         Subjective: Patient seen and examined.  He has no complaints.  He is eager to go home but understands that due to creatinine, he has to stay here.  Objective: Vitals:   08/16/23 0931 08/16/23 1000 08/16/23 1100 08/16/23 1143  BP: 124/62 (!) 154/77 106/70   Pulse: 75 72 80   Resp:   (!) 21   Temp:    97.6 F (36.4 C)  TempSrc:    Oral  SpO2: 95% 98% 97%   Weight:      Height:        Intake/Output Summary (Last 24 hours) at 08/16/2023 1213 Last data filed at 08/15/2023 1653 Gross per 24 hour  Intake 100 ml  Output --  Net 100 ml   Filed Weights   08/14/23 0551 08/15/23 0500 08/16/23 0525  Weight: 88.1 kg 87.5 kg 87.9  kg    Examination:  General exam: Appears calm and comfortable  Respiratory system: Clear to auscultation. Respiratory effort normal. Cardiovascular system: S1 & S2 heard, RRR. No JVD, murmurs, rubs, gallops or clicks. No pedal edema. Gastrointestinal system: Abdomen is nondistended, soft and nontender. No organomegaly or masses felt. Normal bowel sounds heard. Central nervous system: Alert and oriented. No focal neurological deficits. Extremities: Symmetric 5 x 5 power. Skin: No rashes, lesions or ulcers.  Psychiatry: Judgement and insight appear normal. Mood & affect appropriate.   Data Reviewed: I have personally reviewed following labs and imaging studies  CBC: Recent Labs  Lab 08/10/23 0530 08/11/23 1752 08/12/23 0507 08/13/23 0428 08/14/23 0416  WBC 8.4 13.4* 9.8 8.1 7.0  HGB 11.3* 11.8* 11.0* 11.0* 10.8*  HCT 36.1* 38.5* 36.0* 35.8* 34.3*  MCV 104.3* 106.9* 105.3*  102.3* 103.9*  PLT 190 205 171 159 191   Basic Metabolic Panel: Recent Labs  Lab 08/09/23 1700 08/10/23 0530 08/11/23 0409 08/12/23 0507 08/13/23 0428 08/14/23 0416 08/15/23 1104 08/16/23 0930  NA  --    < > 141 139 135 135 136 137  K  --    < > 4.7 4.1 4.4 3.8 4.0 3.4*  CL  --    < > 112* 107 105 103 103 104  CO2  --    < > 21* 21* 21* 21* 22 23  GLUCOSE  --    < > 110* 129* 124* 115* 128* 167*  BUN  --    < > 35* 38* 48* 50* 52* 54*  CREATININE  --    < > 1.66* 2.10* 2.31* 2.14* 2.18* 2.38*  CALCIUM   --    < > 8.5* 8.3* 8.1* 8.0* 8.2* 8.2*  MG 2.2  --  2.1 2.1  --  2.0  --   --    < > = values in this interval not displayed.   GFR: Estimated Creatinine Clearance: 24.5 mL/min (A) (by C-G formula based on SCr of 2.38 mg/dL (H)). Liver Function Tests: No results for input(s): AST, ALT, ALKPHOS, BILITOT, PROT, ALBUMIN  in the last 168 hours. No results for input(s): LIPASE, AMYLASE in the last 168 hours. No results for input(s): AMMONIA in the last 168 hours. Coagulation Profile: No results for input(s): INR, PROTIME in the last 168 hours. Cardiac Enzymes: No results for input(s): CKTOTAL, CKMB, CKMBINDEX, TROPONINI in the last 168 hours. BNP (last 3 results) No results for input(s): PROBNP in the last 8760 hours. HbA1C: No results for input(s): HGBA1C in the last 72 hours. CBG: Recent Labs  Lab 08/11/23 1425  GLUCAP 150*   Lipid Profile: No results for input(s): CHOL, HDL, LDLCALC, TRIG, CHOLHDL, LDLDIRECT in the last 72 hours. Thyroid Function Tests: No results for input(s): TSH, T4TOTAL, FREET4, T3FREE, THYROIDAB in the last 72 hours. Anemia Panel: No results for input(s): VITAMINB12, FOLATE, FERRITIN, TIBC, IRON, RETICCTPCT in the last 72 hours. Sepsis Labs: Recent Labs  Lab 08/09/23 1335 08/11/23 1752 08/12/23 0507  PROCALCITON  --  0.12 0.11  LATICACIDVEN 1.3  --   --     Recent Results  (from the past 240 hours)  Blood culture (routine x 2)     Status: None   Collection Time: 08/09/23 10:28 AM   Specimen: BLOOD  Result Value Ref Range Status   Specimen Description BLOOD LEFT ANTECUBITAL  Final   Special Requests   Final    BOTTLES DRAWN AEROBIC AND ANAEROBIC Blood Culture results may not be optimal  due to an inadequate volume of blood received in culture bottles   Culture   Final    NO GROWTH 5 DAYS Performed at [facility], [address] [place], [place] [address]    Report Status 08/14/2023 FINAL  Final  Blood culture (routine x 2)     Status: None   Collection Time: 08/09/23 11:41 AM   Specimen: BLOOD  Result Value Ref Range Status   Specimen Description BLOOD RIGHT ANTECUBITAL  Final   Special Requests   Final    BOTTLES DRAWN AEROBIC AND ANAEROBIC Blood Culture results may not be optimal due to an inadequate volume of blood received in culture bottles   Culture   Final    NO GROWTH 5 DAYS Performed at [facility], [address] [place], [place] [address]    Report Status 08/14/2023 FINAL  Final  Resp panel by RT-PCR (RSV, Flu A&B, Covid) Anterior Nasal Swab     Status: None   Collection Time: 08/09/23 12:37 PM   Specimen: Anterior Nasal Swab  Result Value Ref Range Status   SARS Coronavirus 2 by RT PCR NEGATIVE NEGATIVE Final    Comment: (NOTE) SARS-CoV-2 target nucleic acids are NOT DETECTED.  The SARS-CoV-2 RNA is generally detectable in upper respiratory specimens during the acute phase of infection. The lowest concentration of SARS-CoV-2 viral copies this assay can detect is 138 copies/mL. A negative result does not preclude SARS-Cov-2 infection and should not be used as the sole basis for treatment or other patient management decisions. A negative result may occur with  improper specimen collection/handling, submission of specimen other than nasopharyngeal swab, presence of viral mutation(s) within the areas targeted by this assay, and  inadequate number of viral copies(<138 copies/mL). A negative result must be combined with clinical observations, patient history, and epidemiological information. The expected result is Negative.  Fact Sheet for Patients:  bloggercourse.com  Fact Sheet for Healthcare Providers:  seriousbroker.it  This test is no t yet approved or cleared by the [place]  FDA and  has been authorized for detection and/or diagnosis of SARS-CoV-2 by FDA under an Emergency Use Authorization (EUA). This EUA will remain  in effect (meaning this test can be used) for the duration of the COVID-19 declaration under Section 564(b)(1) of the Act, 21 U.S.C.section 360bbb-3(b)(1), unless the authorization is terminated  or revoked sooner.       Influenza A by PCR NEGATIVE NEGATIVE Final   Influenza B by PCR NEGATIVE NEGATIVE Final    Comment: (NOTE) The Xpert Xpress SARS-CoV-2/FLU/RSV plus assay is intended as an aid in the diagnosis of influenza from Nasopharyngeal swab specimens and should not be used as a sole basis for treatment. Nasal washings and aspirates are unacceptable for Xpert Xpress SARS-CoV-2/FLU/RSV testing.  Fact Sheet for Patients: bloggercourse.com  Fact Sheet for Healthcare Providers: seriousbroker.it  This test is not yet approved or cleared by the [place]  FDA and has been authorized for detection and/or diagnosis of SARS-CoV-2 by FDA under an Emergency Use Authorization (EUA). This EUA will remain in effect (meaning this test can be used) for the duration of the COVID-19 declaration under Section 564(b)(1) of the Act, 21 U.S.C. section 360bbb-3(b)(1), unless the authorization is terminated or revoked.     Resp Syncytial Virus by PCR NEGATIVE NEGATIVE Final    Comment: (NOTE) Fact Sheet for Patients: bloggercourse.com  Fact Sheet for Healthcare  Providers: seriousbroker.it  This test is not yet approved or cleared by the [place]  FDA and  has been authorized for detection and/or diagnosis of SARS-CoV-2 by FDA under an Emergency Use Authorization (EUA). This EUA will remain in effect (meaning this test can be used) for the duration of the COVID-19 declaration under Section 564(b)(1) of the Act, 21 U.S.C. section 360bbb-3(b)(1), unless the authorization is terminated or revoked.  Performed at [facility], [address], [place], [place] [address]   MRSA Next Gen by PCR, Nasal     Status: None   Collection Time: 08/11/23  2:52 PM   Specimen: Nasal Mucosa; Nasal Swab  Result Value Ref Range Status   MRSA by PCR Next Gen NOT DETECTED NOT DETECTED Final    Comment: (NOTE) The GeneXpert MRSA Assay (FDA approved for NASAL specimens only), is one component of a comprehensive MRSA colonization surveillance program. It is not intended to diagnose MRSA infection nor to guide or monitor treatment for MRSA infections. Test performance is not FDA approved in patients less than 96 years old. Performed at [facility], [address] [place], [place] [address]      Radiology Studies: No results found.   Scheduled Meds:  amiodarone   200 mg Oral BID   apixaban   5 mg Oral BID   Chlorhexidine  Gluconate Cloth  6 each Topical Daily   docusate sodium   100 mg Oral Daily   metoprolol  succinate  100 mg Oral Daily   pantoprazole   40 mg Oral Daily   rosuvastatin   20 mg Oral QHS   Continuous Infusions:     LOS: 7 days   Fredia Skeeter, MD [facility]  08/16/2023, 12:13 PM   *Please note that this is a verbal dictation therefore any spelling or grammatical errors are due to the Dragon Medical One system interpretation.  Please page via Amion and do not message via secure chat for urgent patient care matters. Secure chat can be used for non urgent patient care matters.  How to contact the [facility]  Attending or Consulting provider 7A - 7P or covering provider during after hours 7P -7A, for this patient?  Check the care team in [facility] and look for a) attending/consulting TR[facility] provider listed and b) the TR[facility] team listed. Page or secure chat 7A-7P. Log into [email or web address] and use [employer]'s universal password to access. If you do not have the password, please contact the hospital operator. Locate the [facility] provider you are looking for under [facility] and page to a number that you can be directly reached. If you still have difficulty reaching the provider, please page the [facility] (Director on Call) for the Hospitalists listed on amion for assistance.

## 2023-08-16 NOTE — Plan of Care (Signed)
  Problem: Clinical Measurements: Goal: Respiratory complications will improve Outcome: Progressing Goal: Cardiovascular complication will be avoided Outcome: Progressing   Problem: Pain Managment: Goal: General experience of comfort will improve and/or be controlled Outcome: Progressing   Problem: Activity: Goal: Ability to tolerate increased activity will improve Outcome: Progressing   Problem: Clinical Measurements: Goal: Ability to maintain a body temperature in the normal range will improve Outcome: Progressing   Problem: Respiratory: Goal: Ability to maintain adequate ventilation will improve Outcome: Progressing Goal: Ability to maintain a clear airway will improve Outcome: Progressing   Problem: Education: Goal: Knowledge of disease or condition will improve Outcome: Progressing Goal: Understanding of medication regimen will improve Outcome: Progressing Goal: Individualized Educational Video(s) Outcome: Progressing   Problem: Activity: Goal: Ability to tolerate increased activity will improve Outcome: Progressing   Problem: Cardiac: Goal: Ability to achieve and maintain adequate cardiopulmonary perfusion will improve Outcome: Progressing   Problem: Health Behavior/Discharge Planning: Goal: Ability to safely manage health-related needs after discharge will improve Outcome: Progressing

## 2023-08-17 DIAGNOSIS — I429 Cardiomyopathy, unspecified: Secondary | ICD-10-CM

## 2023-08-17 DIAGNOSIS — I82462 Acute embolism and thrombosis of left calf muscular vein: Secondary | ICD-10-CM

## 2023-08-17 DIAGNOSIS — I4891 Unspecified atrial fibrillation: Secondary | ICD-10-CM | POA: Diagnosis not present

## 2023-08-17 DIAGNOSIS — I2510 Atherosclerotic heart disease of native coronary artery without angina pectoris: Secondary | ICD-10-CM

## 2023-08-17 MED ORDER — OLMESARTAN MEDOXOMIL 40 MG PO TABS: 40.0000 mg | ORAL_TABLET | Freq: Every day | ORAL | 3 refills | Status: AC

## 2023-08-17 MED ORDER — APIXABAN 5 MG PO TABS: 5.0000 mg | ORAL_TABLET | Freq: Two times a day (BID) | ORAL | 5 refills | Status: AC

## 2023-08-17 MED ORDER — TORSEMIDE 20 MG PO TABS
20.0000 mg | ORAL_TABLET | Freq: Every day | ORAL | 2 refills | Status: AC
Start: 2023-08-17 — End: ?

## 2023-08-17 MED ORDER — METOPROLOL SUCCINATE ER 25 MG PO TB24: 25.0000 mg | ORAL_TABLET | Freq: Every day | ORAL | 3 refills | Status: AC

## 2023-08-17 MED ORDER — AMIODARONE HCL 200 MG PO TABS: 200.0000 mg | ORAL_TABLET | ORAL | 3 refills | Status: AC

## 2023-08-17 NOTE — Discharge Summary (Signed)
Juan Arnold, is a 89 y.o. male  DOB [birth year]  MRN [id number].  Admission date:  08/09/2023  Admitting Physician  Alvira Philips [place], DO  Discharge Date:  08/17/2023   Primary MD  Lauro Regulus, MD  Recommendations for primary care physician for things to follow:  1)Very Low-salt diet advised---Less than 2 gm of Sodium per day advised----ok to use Mrs DASH salt substitute instead of Salt 2)Weigh yourself daily, call if you gain more than 3 pounds in 1 day or more than 5 pounds in 1 week as your diuretic medications may need to be adjusted 3)Limit your Fluid  intake to no more than 60 ounces (1.8 Liters) per day 4)Take Amiodarone 200 mg twice daily for  3 weeks,  followed by Amiodarone  200 mg once daily indefinitely 5)Avoid ibuprofen/Advil/Aleve/Motrin/Goody Powders/Naproxen/BC powders/Meloxicam/Diclofenac/Indomethacin and other Nonsteroidal anti-inflammatory medications as these will make you more likely to bleed and can cause stomach ulcers, can also cause Kidney problems.  6)Repeat CBC and BMP in 1 week   Admission Diagnosis  Atrial fibrillation with rapid ventricular response (HCC) [I48.91] Atrial fibrillation with RVR (HCC) [I48.91]   Discharge Diagnosis  Atrial fibrillation with rapid ventricular response (HCC) [I48.91] Atrial fibrillation with RVR (HCC) [I48.91]    Principal Problem:   Atrial fibrillation with RVR (HCC) Active Problems:   DVT (deep venous thrombosis) (HCC)   Colon cancer (HCC)   CKD (chronic kidney disease) stage 3, GFR 30-59 ml/min (HCC)   HTN (hypertension)   RBBB   Atrial fibrillation with rapid ventricular response (HCC)      Past Medical History:  Diagnosis Date   Aortic atherosclerosis (HCC)    Bilateral inguinal hernia    Bradycardia    CAD (coronary artery disease) 07/23/2021   a.) LHC 07/23/2021: 40% mLM, 30% p-mLAD, 20/20% pRCA, 30% mRCA - med mgmt   CKD  (chronic kidney disease) stage 3, GFR 30-59 ml/min (HCC)    Colon cancer (HCC)    a.) s/p partial colectomy (LAR); "they removed 14-17 inches of my colon"   DVT femoral (deep venous thrombosis) with thrombophlebitis, left (HCC) 02/19/2023   a.) lower extremity duplex 02/19/2023: LEFT calf DVT and superficial venous thrombophlebitis   Dyspnea    Edema    GERD (gastroesophageal reflux disease)    Heart murmur    History of bilateral cataract extraction 2018   HLD (hyperlipidemia)    HOH; uses BILATERAL hearing aids    Hypertension    IBS (irritable bowel syndrome)    Non-traumatic compression fracture of L3 lumbar vertebra (HCC)    On apixaban therapy    Palpitations (PVC/PACs)    Pre-diabetes    PUD (peptic ulcer disease)    RBBB (right bundle branch block)    Renal cyst    SA node dysfunction (HCC)    Secondary hyperparathyroidism (HCC)    Valvular insufficiency 05/06/2021   a.) TTE 05/06/2021: EF >55%, mild LVH, sev LAE, mod RAE, triv AR, mod PR, mod-sev TR, sev MR, RVSP 71.8    Past  Surgical History:  Procedure Laterality Date   APPENDECTOMY  1955   ruptured!   CATARACT EXTRACTION W/PHACO Left 10/29/2016   Procedure: CATARACT EXTRACTION PHACO AND INTRAOCULAR LENS PLACEMENT (IOC);  Surgeon: Sallee Lange, MD;  Location: [facility] ORS;  Service: Ophthalmology;  Laterality: Left;  [place] 2:05.4 AP% 25.3 CDE 49.50 FLUID PACK LOT # [id number]   CATARACT EXTRACTION W/PHACO Right 11/26/2016   Procedure: CATARACT EXTRACTION PHACO AND INTRAOCULAR LENS PLACEMENT (IOC);  Surgeon: Sallee Lange, MD;  Location: [facility] ORS;  Service: Ophthalmology;  Laterality: Right;  [place] 1:36.9 AP% 22.2 CDE 33.97 FLUID PACK LOT # [id number]   COLECTOMY     INGUINAL HERNIA REPAIR Right 1954   LEFT HEART CATH AND CORONARY ANGIOGRAPHY N/A 07/23/2021   Procedure: LEFT HEART CATH AND CORONARY ANGIOGRAPHY;  Surgeon: Marcina Millard, MD;  Location: [facility] INVASIVE CV [facility];  Service: Cardiovascular;  Laterality:  N/A;   PARTIAL COLECTOMY  2006   PILONIDAL CYST EXCISION       HPI  from the history and physical done on the day of admission:    Chief Complaint: Difficulty Breathing   HPI: Juan Arnold is a 89 y.o. male with medical history significant for hypertension, DVT, peptic ulcer disease, colon cancer, CKD 3, coronary artery disease. Patient presented to the ED with complaints of difficulty breathing worse with exertion and improves with rest, that started about a week ago, also with accompanying fatigue.  Over the past 3 days symptoms have significantly worsened.  Also reports of worsening cough over the past 2 weeks.  No lower extremity swelling, no chest pain.  No palpitations.  No history of atrial fibrillation.  He was started on Eliquis for DVT about 2 to 3 months ago and he reports compliance.  Reports remote smoking history.   ED Course: Tmax 98.6.  Heart rate 79-97.  Respiratory rate 16-26.  Blood pressures 100- 130.  O2 sats > 97% on room air. BNP 1193.  Lactic acid 1.5.  Troponin 25. Chest x-ray questions pulmonary venous hypertension, possibly small pleural effusion, emphysema. Cardizem 10mg  and then bolus started. EDP wanted to get CTA done, patient did want exposure to contrast, hence VQ scan ordered-negative for acute PE.   Review of Systems: As per HPI all other systems reviewed and negative.     Hospital Course:   Assessment and Plan:  1)New onset paroxysmal atrial fibrillation with RVR- POA: -Cardiology consult appreciated - already on Eliquis due to history of thromboembolism in the past.  - TTE with LVEF 40%, LV moderately decreased function with global hypokinesis, LV moderately dilated, mild LVH, biatrial enlargement, moderate MR, IVC dilated.  -Cardiology consult appreciated -Transition from IV amiodarone to oral amiodarone taper--Amiodarone 200 mg twice daily for  3 weeks,  followed by Amiodarone  200 mg once daily indefinitely -Continue metoprolol   2)Concern  for developing hospital-acquired pneumonia -Treated with Rocephin and doxycycline.   3)Acute hypoxic respiratory failure secondary to acute on chronic systolic congestive heart failure, new diagnosis TTE with LVEF 40%, LV moderately decreased function with global hypokinesis, LV moderately dilated, mild LVH, biatrial enlargement, moderate MR, IVC dilated. -- BNP 1193>1136>1172.0>1173.0.   -Improved with iv lasix -Required Diuretic holiday due to creatinine trending up As per Cardiology ok to dc on oral torsemide, olmesartan  -Hypoxia Resolved, currently on Room Air -Still not a good candidate for MRA or SGLT2 inhibitor at this point.   4)History of DVT -- Eliquis    5)AKI on CKD stage IIIb -- Cr 1.63>1.72>1.66>2.10>2.31>  2.14> 2.18> 2.38 (baseline 1.50 05/18/2023).    -- Avoid nephrotoxins, renally dose all medications.    6)Hypokalemia: Replenished.   7)Nonobstructive CAD/hyperlipidemia Underwent left heart catheterization 07/23/2021 with mid LM lesion 40% stenosis, proximal LAD-mid LAD lesion 30% stenosed, proximal RCA-1 lesion 20% stenosed, proximal RCA-2 lesion 20% stenosed, mid RCA lesion 30% stenosis, LVEF 55-65%. -- Crestor 20 mg p.o. daily   8)Essential hypertension Home regimen includes metoprolol succinate 25 mg p.o. daily, olmesartan 40 mg p.o. daily. -- Metoprolol succinate increased to 100 mg p.o. daily as above -- Holding home olmesartan for now, blood pressure improving but need a solid stable trend before initiating any other medications. -- Monitor BP   Peptic ulcer disease Currently not on medication outpatient. -- pepcid PRN     Discharge Condition: stable  Follow UP   Follow-up Information     Lauro Regulus, MD. Schedule an appointment as soon as possible for a visit in 1 week(s).   Specialty: Internal Medicine Contact information: [address] [facility] [place] [address] [phone]         [facility]  [place]. Schedule an appointment as soon as possible for a visit.   Specialty: Cardiology Contact information: [address] [place] [address] [phone]                Consults obtained - Cardiology  Diet and Activity recommendation:  As advised  Discharge Instructions   Discharge Instructions     Ambulatory referral to Cardiology   Complete by: As directed    Call MD for:  difficulty breathing, headache or visual disturbances   Complete by: As directed    Call MD for:  persistant dizziness or light-headedness   Complete by: As directed    Call MD for:  persistant nausea and vomiting   Complete by: As directed    Call MD for:  temperature >100.4   Complete by: As directed    Diet - low sodium heart healthy   Complete by: As directed    Discharge instructions   Complete by: As directed    1)Very Low-salt diet advised---Less than 2 gm of Sodium per day advised----ok to use Mrs DASH salt substitute instead of Salt 2)Weigh yourself daily, call if you gain more than 3 pounds in 1 day or more than 5 pounds in 1 week as your diuretic medications may need to be adjusted 3)Limit your Fluid  intake to no more than 60 ounces (1.8 Liters) per day 4)Take Amiodarone 200 mg twice daily for  3 weeks,  followed by Amiodarone  200 mg once daily indefinitely 5)Avoid ibuprofen/Advil/Aleve/Motrin/Goody Powders/Naproxen/BC powders/Meloxicam/Diclofenac/Indomethacin and other Nonsteroidal anti-inflammatory medications as these will make you more likely to bleed and can cause stomach ulcers, can also cause Kidney problems.  6)Repeat CBC and BMP in 1 week   Increase activity slowly   Complete by: As directed        Discharge Medications     Allergies as of 08/17/2023       Reactions   Plasticized Base [plastibase]    Pt states he had "blood clot in arm from a plastic device"        Medication List     TAKE these medications    acetaminophen 650 MG CR  tablet Commonly known as: TYLENOL Take 650 mg by mouth every 8 (eight) hours as needed for pain.   amiodarone 200 MG tablet Commonly known as: PACERONE Take 1 tablet (  200 mg total) by mouth See admin instructions. Take Amiodarone 200 mg twice daily for  3 weeks,  followed by Amiodarone  200 mg once daily indefinitely   apixaban 5 MG Tabs tablet Commonly known as: Eliquis Take 1 tablet (5 mg total) by mouth 2 (two) times daily.   cholestyramine 4 g packet Commonly known as: QUESTRAN Take 4 g by mouth 2 (two) times daily.   metoprolol succinate 25 MG 24 hr tablet Commonly known as: TOPROL-XL Take 1 tablet (25 mg total) by mouth at bedtime.   olmesartan 40 MG tablet Commonly known as: BENICAR Take 1 tablet (40 mg total) by mouth daily.   rosuvastatin 20 MG tablet Commonly known as: CRESTOR Take 20 mg by mouth at bedtime.   torsemide 20 MG tablet Commonly known as: DEMADEX Take 1 tablet (20 mg total) by mouth daily.   traMADol 50 MG tablet Commonly known as: ULTRAM Take 1 tablet (50 mg total) by mouth 2 (two) times daily as needed for severe pain (pain score 7-10).       Major procedures and Radiology Reports - PLEASE review detailed and final reports for all details, in brief -   DG Abd 1 View Result Date: 08/14/2023 CLINICAL DATA:  Ileus, abdominal pain EXAM: ABDOMEN - 1 VIEW COMPARISON:  11/26/2022 FINDINGS: Two supine frontal views of the abdomen and pelvis are obtained. There is diffuse gaseous distension of the large and small bowel compatible with given history of ileus. No masses or abnormal calcifications. Lung bases are clear. No acute bony abnormalities. IMPRESSION: 1. Diffuse gaseous distension of the large and small bowel, compatible with given history of ileus. Electronically Signed   By: Sharlet Salina M.D.   On: 08/14/2023 14:45   DG CHEST PORT 1 VIEW Result Date: 08/12/2023 CLINICAL DATA:  Short of breath EXAM: PORTABLE CHEST 1 VIEW COMPARISON:  None Available.  FINDINGS: Markedly enlarged cardiac silhouette. No interval change. No effusion, infiltrate, or pneumothorax. No acute osseous abnormality. IMPRESSION: Cardiomegaly.  No interval change. Electronically Signed   By: Genevive Bi M.D.   On: 08/12/2023 08:27   DG CHEST PORT 1 VIEW Result Date: 08/11/2023 CLINICAL DATA:  Severe shortness of breath. EXAM: PORTABLE CHEST 1 VIEW COMPARISON:  08/09/2023 FINDINGS: Stable significantly enlarged cardiac silhouette. Mild prominence of the pulmonary vasculature and interstitial markings with improvement. Probable small bilateral pleural effusions. Unremarkable bones. IMPRESSION: Cardiomegaly and mild congestive heart failure with improvement. Electronically Signed   By: Beckie Salts M.D.   On: 08/11/2023 17:18   ECHOCARDIOGRAM COMPLETE Result Date: 08/10/2023    ECHOCARDIOGRAM REPORT   Patient Name:   Juan Arnold Date of Exam: 08/10/2023 Medical Rec #:  [id number]      Height:       72.0 in Accession #:    [id number]     Weight:       180.0 lb Date of Birth:  Jan 15, 1935     BSA:          2.037 m Patient Age:    88 years       BP:           124/79 mmHg Patient Gender: M              HR:           81 bpm. [facility] Procedure: 2D Echo, Cardiac Doppler, Color Doppler and Intracardiac            Opacification Agent Indications:  Atrial Fibrillation I48.91  History:        Patient has no prior history of Echocardiogram examinations.                 Arrythmias:RBBB and Atrial Fibrillation; Risk                 Factors:Hypertension.  Sonographer:    Webb Laws Referring Phys: [id number] EJIROGHENE E Cleotha Tsang IMPRESSIONS  1. Left ventricular ejection fraction, by estimation, is 40%. The left ventricle has moderately decreased function. The left ventricle demonstrates global hypokinesis. The left ventricular internal cavity size was moderately dilated. There is mild left ventricular hypertrophy. Left ventricular diastolic parameters are indeterminate.  2. Right  ventricular systolic function is low normal. The right ventricular size is normal.  3. Left atrial size was severely dilated.  4. Right atrial size was severely dilated.  5. Moderate mitral valve regurgitation.  6. Tricuspid valve regurgitation is severe.  7. The aortic valve is tricuspid. Aortic valve regurgitation is not visualized. Aortic valve sclerosis is present, with no evidence of aortic valve stenosis.  8. The inferior vena cava is dilated in size with <50% respiratory variability, suggesting right atrial pressure of 15 mmHg. FINDINGS  Left Ventricle: Left ventricular ejection fraction, by estimation, is 40%. The left ventricle has moderately decreased function. The left ventricle demonstrates global hypokinesis. The left ventricular internal cavity size was moderately dilated. There is mild left ventricular hypertrophy. Left ventricular diastolic parameters are indeterminate. Right Ventricle: The right ventricular size is normal. Right vetricular wall thickness was not assessed. Right ventricular systolic function is low normal. Left Atrium: Left atrial size was severely dilated. Right Atrium: Right atrial size was severely dilated. Pericardium: There is no evidence of pericardial effusion. Mitral Valve: There is mild thickening of the mitral valve leaflet(s). Moderate mitral valve regurgitation. Tricuspid Valve: The tricuspid valve is normal in structure. Tricuspid valve regurgitation is severe. Aortic Valve: The aortic valve is tricuspid. Aortic valve regurgitation is not visualized. Aortic valve sclerosis is present, with no evidence of aortic valve stenosis. Pulmonic Valve: The pulmonic valve was not well visualized. Pulmonic valve regurgitation is not visualized. Aorta: The aortic root and ascending aorta are structurally normal, with no evidence of dilitation. Venous: The inferior vena cava is dilated in size with less than 50% respiratory variability, suggesting right atrial pressure of 15 mmHg.  IAS/Shunts: No atrial level shunt detected by color flow Doppler.  LEFT VENTRICLE PLAX 2D LVIDd:         6.00 cm      Diastology LVIDs:         2.65 cm      LV e' medial:   7.62 cm/s LV PW:         1.20 cm      LV E/e' medial: 10.9 LV IVS:        1.20 cm LVOT diam:     2.50 cm LV SV:         57 LV SV Index:   28 LVOT Area:     4.91 cm  LV Volumes (MOD) LV vol d, MOD A2C: 119.0 ml LV vol d, MOD A4C: 108.0 ml LV vol s, MOD A2C: 42.6 ml LV vol s, MOD A4C: 48.4 ml LV SV MOD A2C:     76.4 ml LV SV MOD A4C:     108.0 ml LV SV MOD BP:      68.8 ml RIGHT VENTRICLE  IVC RV Basal diam:  4.90 cm     IVC diam: 3.10 cm RV Mid diam:    3.00 cm RV S prime:     10.30 cm/s TAPSE (M-mode): 2.0 cm LEFT ATRIUM              Index        RIGHT ATRIUM           Index LA diam:        5.20 cm  2.55 cm/m   RA Area:     43.40 cm LA Vol (A2C):   110.0 ml 53.99 ml/m  RA Volume:   188.00 ml 92.28 ml/m LA Vol (A4C):   124.0 ml 60.87 ml/m LA Biplane Vol: 130.0 ml 63.81 ml/m  AORTIC VALVE LVOT Vmax:   59.60 cm/s LVOT Vmean:  41.867 cm/s LVOT VTI:    0.116 m  AORTA Ao Root diam: 3.70 cm Ao Asc diam:  3.50 cm MITRAL VALVE               TRICUSPID VALVE MV Area (PHT): 3.68 cm    TR Peak grad:   18.7 mmHg MV Decel Time: 206 msec    TR Vmax:        216.00 cm/s MR Peak grad: 88.0 mmHg MR Mean grad: 52.0 mmHg    SHUNTS MR Vmax:      469.00 cm/s  Systemic VTI:  0.12 m MR Vmean:     336.0 cm/s   Systemic Diam: 2.50 cm MV E velocity: 83.40 cm/s Juan Pates MD Electronically signed by Juan Pates MD Signature Date/Time: 08/10/2023/8:08:10 PM    Final    NM Pulmonary Perfusion Result Date: 08/09/2023 CLINICAL DATA:  Rule out pulmonary embolism. Complains of shortness of breath and cough. EXAM: NUCLEAR MEDICINE PERFUSION LUNG SCAN TECHNIQUE: Perfusion images were obtained in multiple projections after intravenous injection of radiopharmaceutical. Ventilation scans intentionally deferred if perfusion scan and chest x-ray adequate for  interpretation during COVID 19 epidemic. RADIOPHARMACEUTICALS:  4.1 mCi Tc-57m MAA IV COMPARISON:  Chest radiograph from 08/09/2023. FINDINGS: The chest radiograph from earlier today shows cardiac enlargement and signs suggestive of early CHF. No peripheral, no evidence segmental perfusion defects identified within the lungs identified to indicate acute pulmonary embolus. IMPRESSION: Negative for acute pulmonary embolus. Electronically Signed   By: Signa Kell M.D.   On: 08/09/2023 15:34   DG Chest Port 1 View Result Date: 08/09/2023 CLINICAL DATA:  Shortness of breath, cough, weakness EXAM: PORTABLE CHEST 1 VIEW COMPARISON:  CT chest 05/18/2023 FINDINGS: Moderate to prominent cardiomegaly. Apical lordotic projection. Emphysema. Faintly accentuated interstitial accentuation in the lungs with some mild indistinctness of pulmonary vasculature, query pulmonary venous hypertension. Subtle blunting of the left lateral costophrenic angle. Degenerative glenohumeral arthropathy bilaterally. Thoracic spondylosis. IMPRESSION: 1. Moderate to prominent cardiomegaly with faintly accentuated interstitial accentuation in the lungs and some mild indistinctness of pulmonary vasculature, query pulmonary venous hypertension. 2. Subtle blunting of the left lateral costophrenic angle, possibly a small effusion. 3. Emphysema. 4. Degenerative glenohumeral arthropathy bilaterally. Electronically Signed   By: Gaylyn Rong M.D.   On: 08/09/2023 10:33   Micro Results   Recent Results (from the past 240 hours)  Blood culture (routine x 2)     Status: None   Collection Time: 08/09/23 10:28 AM   Specimen: BLOOD  Result Value Ref Range Status   Specimen Description BLOOD LEFT ANTECUBITAL  Final   Special Requests   Final    BOTTLES DRAWN AEROBIC AND  ANAEROBIC Blood Culture results may not be optimal due to an inadequate volume of blood received in culture bottles   Culture   Final    NO GROWTH 5 DAYS Performed at [facility], [address] [place], [place] [address]    Report Status 08/14/2023 FINAL  Final  Blood culture (routine x 2)     Status: None   Collection Time: 08/09/23 11:41 AM   Specimen: BLOOD  Result Value Ref Range Status   Specimen Description BLOOD RIGHT ANTECUBITAL  Final   Special Requests   Final    BOTTLES DRAWN AEROBIC AND ANAEROBIC Blood Culture results may not be optimal due to an inadequate volume of blood received in culture bottles   Culture   Final    NO GROWTH 5 DAYS Performed at [facility], [address], [place], [place] [address]    Report Status 08/14/2023 FINAL  Final  Resp panel by RT-PCR (RSV, Flu A&B, Covid) Anterior Nasal Swab     Status: None   Collection Time: 08/09/23 12:37 PM   Specimen: Anterior Nasal Swab  Result Value Ref Range Status   SARS Coronavirus 2 by RT PCR NEGATIVE NEGATIVE Final    Comment: (NOTE) SARS-CoV-2 target nucleic acids are NOT DETECTED.  The SARS-CoV-2 RNA is generally detectable in upper respiratory specimens during the acute phase of infection. The lowest concentration of SARS-CoV-2 viral copies this assay can detect is 138 copies/mL. A negative result does not preclude SARS-Cov-2 infection and should not be used as the sole basis for treatment or other patient management decisions. A negative result may occur with  improper specimen collection/handling, submission of specimen other than nasopharyngeal swab, presence of viral mutation(s) within the areas targeted by this assay, and inadequate number of viral copies(<138 copies/mL). A negative result must be combined with clinical observations, patient history, and epidemiological information. The expected result is Negative.  Fact Sheet for Patients:  BloggerCourse.com  Fact Sheet for Healthcare Providers:  SeriousBroker.it  This test is no t yet approved or cleared by the [place] FDA and  has been authorized for  detection and/or diagnosis of SARS-CoV-2 by FDA under an Emergency Use Authorization (EUA). This EUA will remain  in effect (meaning this test can be used) for the duration of the COVID-19 declaration under Section 564(b)(1) of the Act, 21 U.S.C.section 360bbb-3(b)(1), unless the authorization is terminated  or revoked sooner.       Influenza A by PCR NEGATIVE NEGATIVE Final   Influenza B by PCR NEGATIVE NEGATIVE Final    Comment: (NOTE) The Xpert Xpress SARS-CoV-2/FLU/RSV plus assay is intended as an aid in the diagnosis of influenza from Nasopharyngeal swab specimens and should not be used as a sole basis for treatment. Nasal washings and aspirates are unacceptable for Xpert Xpress SARS-CoV-2/FLU/RSV testing.  Fact Sheet for Patients: BloggerCourse.com  Fact Sheet for Healthcare Providers: SeriousBroker.it  This test is not yet approved or cleared by the [place] FDA and has been authorized for detection and/or diagnosis of SARS-CoV-2 by FDA under an Emergency Use Authorization (EUA). This EUA will remain in effect (meaning this test can be used) for the duration of the COVID-19 declaration under Section 564(b)(1) of the Act, 21 U.S.C. section 360bbb-3(b)(1), unless the authorization is terminated or revoked.     Resp Syncytial Virus by PCR NEGATIVE NEGATIVE Final    Comment: (NOTE) Fact Sheet for Patients: BloggerCourse.com  Fact Sheet for Healthcare Providers: SeriousBroker.it  This test is not yet approved  or cleared by the [place] and has been authorized for detection and/or diagnosis of SARS-CoV-2 by FDA under an Emergency Use Authorization (EUA). This EUA will remain in effect (meaning this test can be used) for the duration of the COVID-19 declaration under Section 564(b)(1) of the Act, 21 U.S.C. section 360bbb-3(b)(1), unless the authorization is  terminated or revoked.  Performed at [facility], [address], [place], [place] [address]   MRSA Next Gen by PCR, Nasal     Status: None   Collection Time: 08/11/23  2:52 PM   Specimen: Nasal Mucosa; Nasal Swab  Result Value Ref Range Status   MRSA by PCR Next Gen NOT DETECTED NOT DETECTED Final    Comment: (NOTE) The GeneXpert MRSA Assay (FDA approved for NASAL specimens only), is one component of a comprehensive MRSA colonization surveillance program. It is not intended to diagnose MRSA infection nor to guide or monitor treatment for MRSA infections. Test performance is not FDA approved in patients less than 96 years old. Performed at [facility], [address], [place], [place] [address]    Today   Subjective    Laquan Beier today has no new complaints  -Ambulating without significant dyspnea -No hypoxia .voiding well         Patient has been seen and examined prior to discharge   Objective   Blood pressure (!) 153/89, pulse 89, temperature 97.9 F (36.6 C), temperature source Oral, resp. rate 12, height 6' (1.829 m), weight 87.1 kg, SpO2 95%.   Intake/Output Summary (Last 24 hours) at 08/17/2023 1027 Last data filed at 08/17/2023 0432 Gross per 24 hour  Intake 600 ml  Output --  Net 600 ml    Exam Gen:- Awake Alert, no acute distress , no conversational dyspnea HEENT:- [place].AT, No sclera icterus Neck-Supple Neck,No JVD,.  Lungs-  Improved air movement, no rales  CV- S1, S2 normal, irregular Abd-  +ve B.Sounds, Abd Soft, No tenderness,    Extremity/Skin:- improved  edema,   good pulses Psych-affect is appropriate, oriented x3 Neuro-no new focal deficits, no tremors    Data Review   CBC w Diff:  Lab Results  Component Value Date   WBC 7.0 08/14/2023   HGB 10.8 (L) 08/14/2023   HCT 34.3 (L) 08/14/2023   PLT 191 08/14/2023   CMP:  Lab Results  Component Value Date   NA 137 08/16/2023   K 3.4 (L) 08/16/2023   CL 104 08/16/2023   CO2 23 08/16/2023    BUN 54 (H) 08/16/2023   CREATININE 2.38 (H) 08/16/2023  .  Total Discharge time is about 33 minutes  Shon Hale M.D on 08/17/2023 at 10:27 AM  Go to [email or web address] -  for contact info  [facility] - Office  [phone]

## 2023-08-17 NOTE — Care Management Important Message (Signed)
 Important Message  Patient Details  Name: Juan Arnold MRN: [id number] Date of Birth: [birth year]   Important Message Given:  Yes - Medicare IM     [name] [name] 08/17/2023, 11:45 AM

## 2023-08-17 NOTE — Progress Notes (Signed)
 OT Cancellation Note  Patient Details Name: Juan Arnold MRN: [id number] DOB: 10-20-1934   Cancelled Treatment:    Reason Eval/Treat Not Completed: OT screened, no needs identified, will sign off. Pt appeared to operating at and independent level per physical therapy evaluation. Pt reported no issues with ADL's and independent movement and toileting about the room. Pt will be removed form the OT list.   Thurnell Floss OT, MOT   Thurnell Floss 08/17/2023, 8:58 AM

## 2023-08-17 NOTE — Progress Notes (Addendum)
 Progress Note  Patient Name: Juan Arnold Date of Encounter: 08/17/2023  Primary Cardiologist: Percival Brace, MD  Subjective   No acute events overnight.  Inpatient Medications    Scheduled Meds:  amiodarone   200 mg Oral BID   apixaban   5 mg Oral BID   Chlorhexidine  Gluconate Cloth  6 each Topical Daily   docusate sodium   100 mg Oral Daily   metoprolol  succinate  100 mg Oral Daily   pantoprazole   40 mg Oral Daily   rosuvastatin   20 mg Oral QHS   Continuous Infusions:  PRN Meds: acetaminophen  **OR** acetaminophen , famotidine , guaiFENesin -dextromethorphan , ipratropium, levalbuterol , metoprolol  tartrate, ondansetron  **OR** ondansetron  (ZOFRAN ) IV, mouth rinse, polyethylene glycol, traMADol    Vital Signs    Vitals:   08/17/23 0500 08/17/23 0521 08/17/23 0600 08/17/23 0800  BP: (!) 146/90  139/86   Pulse: 71  67   Resp:   (!) 23   Temp:    97.9 F (36.6 C)  TempSrc:    Oral  SpO2: 96%  98%   Weight:  87.1 kg    Height:        Intake/Output Summary (Last 24 hours) at 08/17/2023 0947 Last data filed at 08/17/2023 0432 Gross per 24 hour  Intake 600 ml  Output --  Net 600 ml   Filed Weights   08/15/23 0500 08/16/23 0525 08/17/23 0521  Weight: 87.5 kg 87.9 kg 87.1 kg    Telemetry     Personally reviewed.  Atrial fibrillation, rate controlled.  HR ranging between 70 to 90s.  ECG    Not performed today.  Physical Exam   GEN: No acute distress.   Neck: No JVD. Cardiac: RRR, no murmur, rub, or gallop.  Respiratory: Nonlabored. Clear to auscultation bilaterally. GI: Soft, nontender, bowel sounds present. MS: No edema; No deformity. Neuro:  Nonfocal. Psych: Alert and oriented x 3. Normal affect.  Labs    Chemistry Recent Labs  Lab 08/14/23 0416 08/15/23 1104 08/16/23 0930  NA 135 136 137  K 3.8 4.0 3.4*  CL 103 103 104  CO2 21* 22 23  GLUCOSE 115* 128* 167*  BUN 50* 52* 54*  CREATININE 2.14* 2.18* 2.38*  CALCIUM  8.0* 8.2* 8.2*   GFRNONAA 29* 29* 26*  ANIONGAP 11 11 10      Hematology Recent Labs  Lab 08/12/23 0507 08/13/23 0428 08/14/23 0416  WBC 9.8 8.1 7.0  RBC 3.42* 3.50* 3.30*  HGB 11.0* 11.0* 10.8*  HCT 36.0* 35.8* 34.3*  MCV 105.3* 102.3* 103.9*  MCH 32.2 31.4 32.7  MCHC 30.6 30.7 31.5  RDW 14.0 13.9 14.1  PLT 171 159 191    Cardiac Enzymes Recent Labs  Lab 08/09/23 1007 08/09/23 1335  TROPONINIHS 25* 23*    BNP Recent Labs  Lab 08/11/23 0409 08/12/23 0507 08/13/23 0428  BNP 1,136.0* 1,172.0* 1,173.0*     DDimerNo results for input(s): "DDIMER" in the last 168 hours.   Radiology    No results found.   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  Started on amiodarone  drip with adequate control of heart rates.  Currently on p.o. amiodarone  200 mg twice daily, metoprolol  succinate 100 mg once daily  and Eliquis  5 mg twice daily.  Would continue amiodarone  200 mg twice daily for 3 weeks followed by amiodarone  200 mg once daily.  Received IV diuretics during this admission with resultant AKI.  He does have left lower EXTR swelling but I think this is secondary to ?  Hypoalbuminemia or ?  Chronic venous insufficiency (has history of left lower EXTR DVT).  He is compensated on examination, does not need any diuretics on discharge.  He is stable to go home today.  He has a cardiologist he follows at [facility] [place].  He will need a repeat echocardiogram in the outpatient setting.  Signed, Lasalle Pointer, MD  08/17/2023, 9:47 AM

## 2023-08-28 ENCOUNTER — Other Ambulatory Visit: Payer: Self-pay | Admitting: Internal Medicine

## 2023-08-28 DIAGNOSIS — N1832 Chronic kidney disease, stage 3b: Secondary | ICD-10-CM

## 2023-08-28 DIAGNOSIS — N289 Disorder of kidney and ureter, unspecified: Secondary | ICD-10-CM

## 2023-08-28 DIAGNOSIS — I10 Essential (primary) hypertension: Secondary | ICD-10-CM

## 2023-09-02 ENCOUNTER — Ambulatory Visit
Admission: RE | Admit: 2023-09-02 | Discharge: 2023-09-02 | Disposition: A | Discharge status: Home / Self Care | Payer: Medicare Other | Source: Ambulatory Visit | Attending: Internal Medicine | Admitting: Internal Medicine

## 2023-09-02 DIAGNOSIS — I10 Essential (primary) hypertension: Secondary | ICD-10-CM | POA: Insufficient documentation

## 2023-09-02 DIAGNOSIS — N1832 Chronic kidney disease, stage 3b: Secondary | ICD-10-CM | POA: Insufficient documentation

## 2023-09-02 DIAGNOSIS — N289 Disorder of kidney and ureter, unspecified: Secondary | ICD-10-CM | POA: Insufficient documentation

## 2023-10-03 IMAGING — US US RENAL
1 series · 14 of 25 positions shown · non-contrast
Comparison: CT abdomen pelvis 05/23/2020

CLINICAL DATA: Chronic renal disease.

EXAM:
RENAL / URINARY TRACT ULTRASOUND COMPLETE

[Series 1: us renal · 0.24mm/px · 14 of 56 slices shown]
[im 1/56]
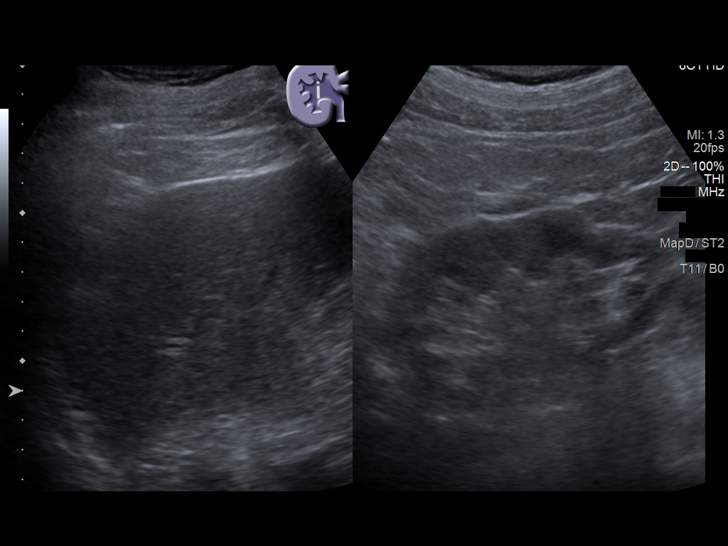
[im 5/56]
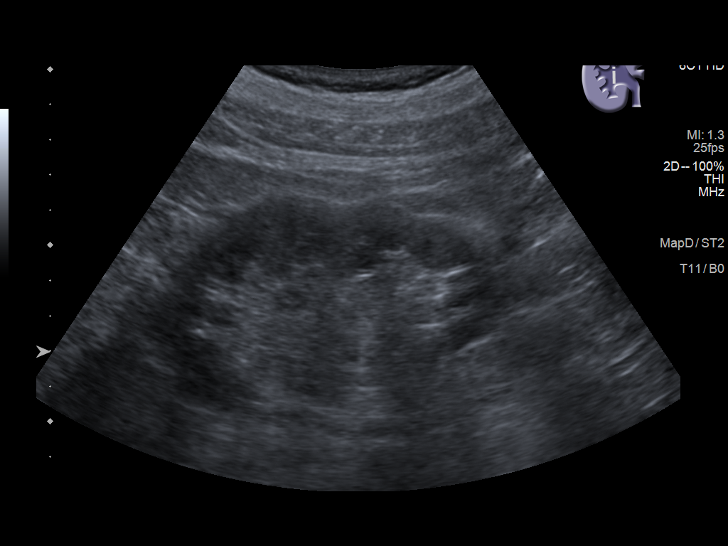
[im 10/56]
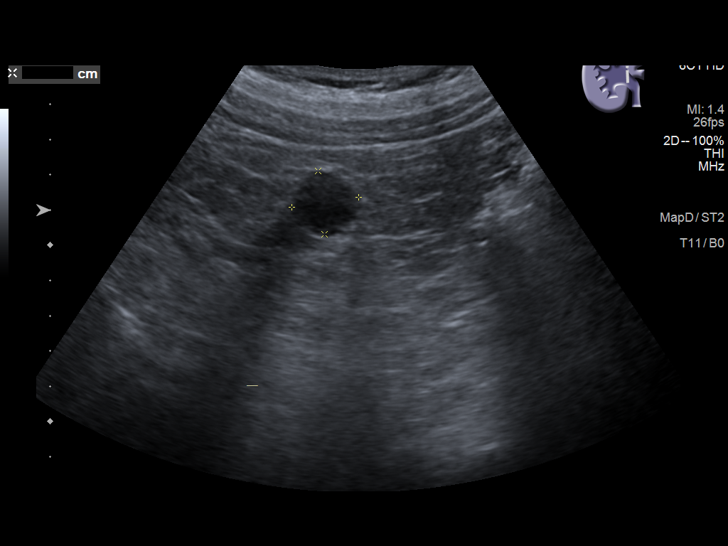
[im 14/56]
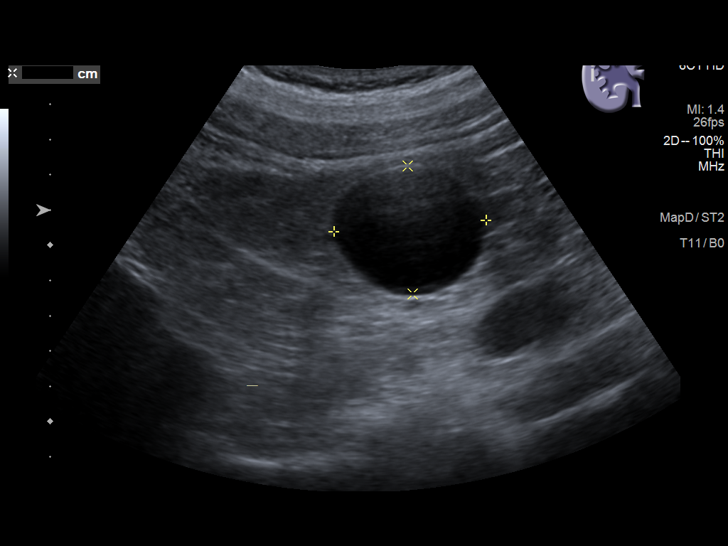
[im 19/56]
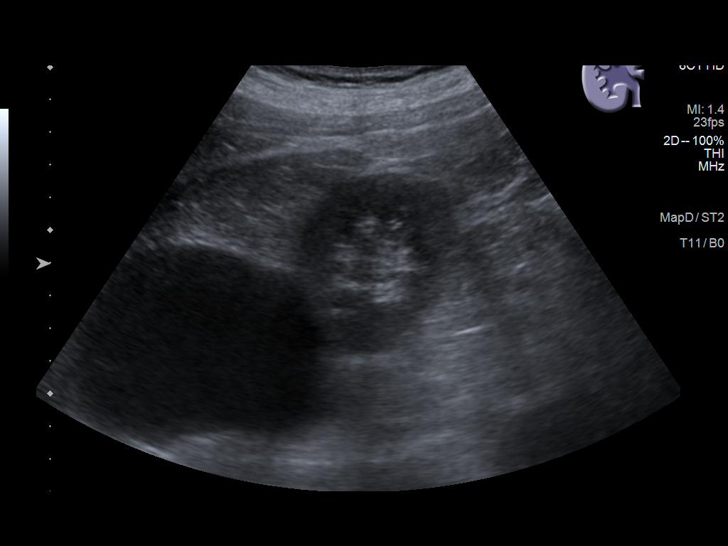
[im 21/56]
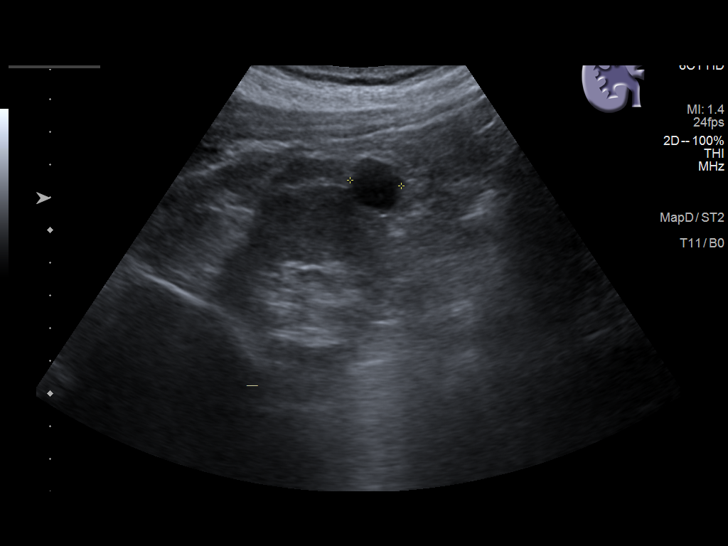
[im 26/56]
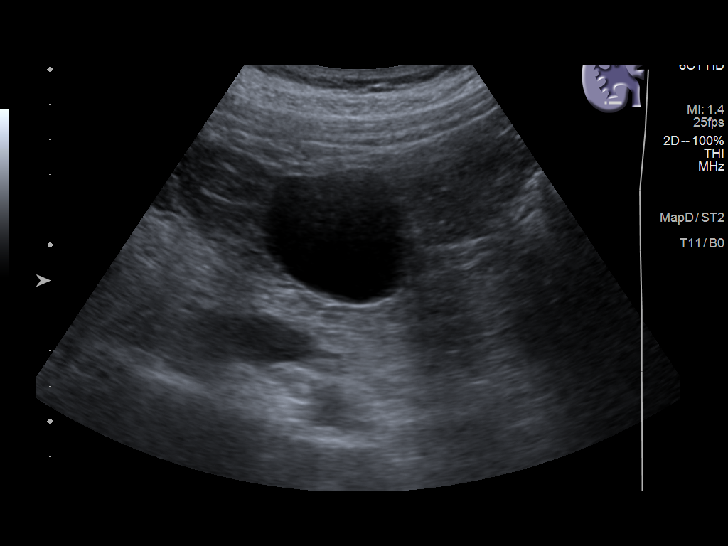
[im 30/56]
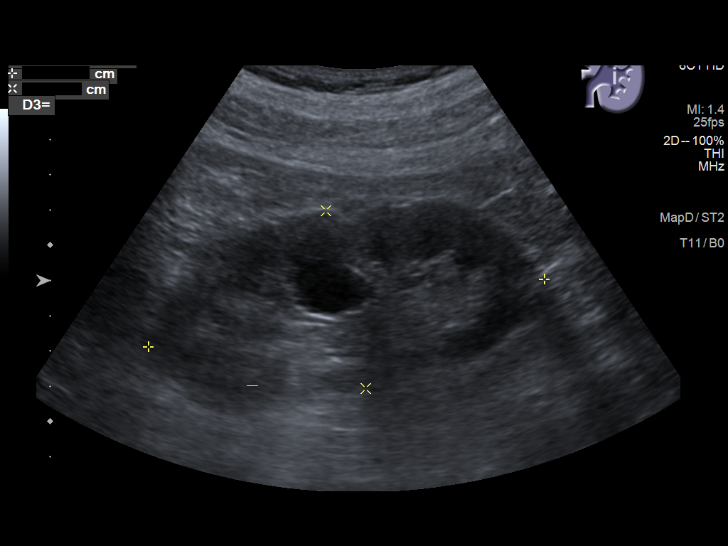
[im 35/56]
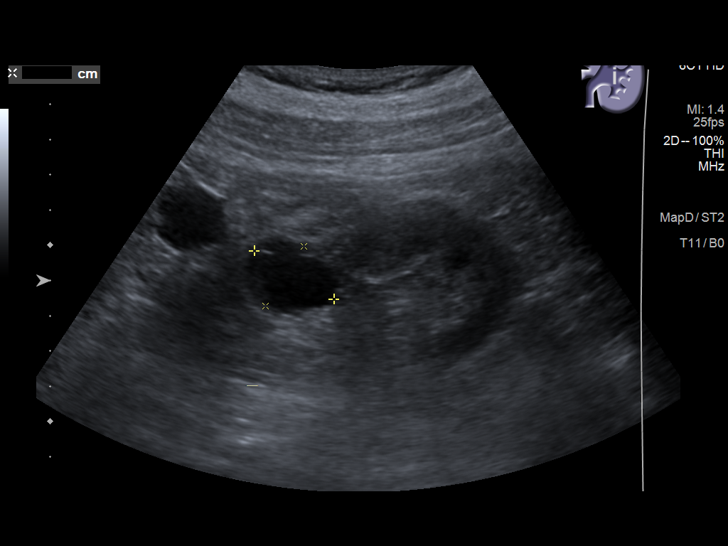
[im 37/56]
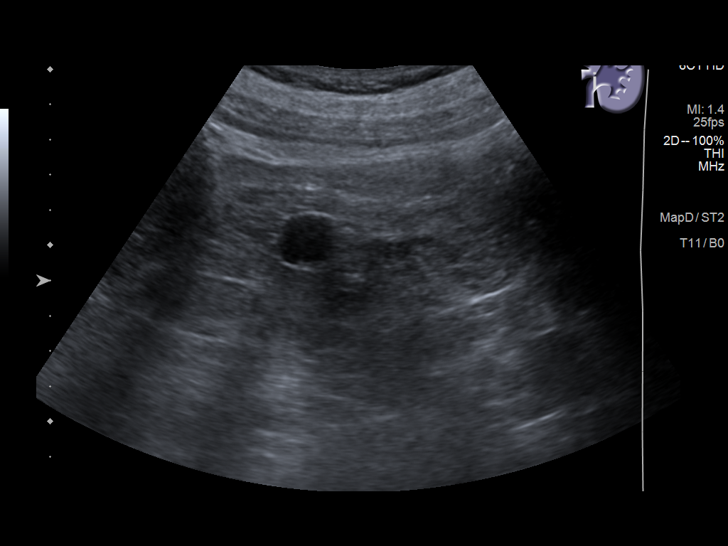
[im 42/56]
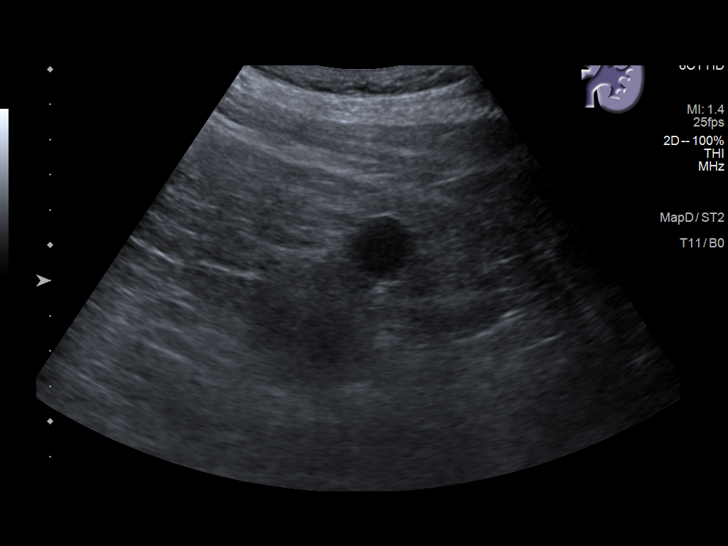
[im 46/56]
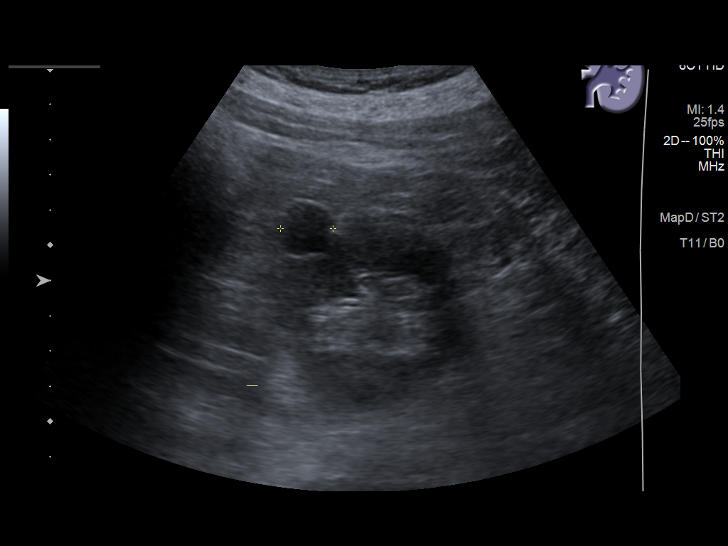
[im 51/56]
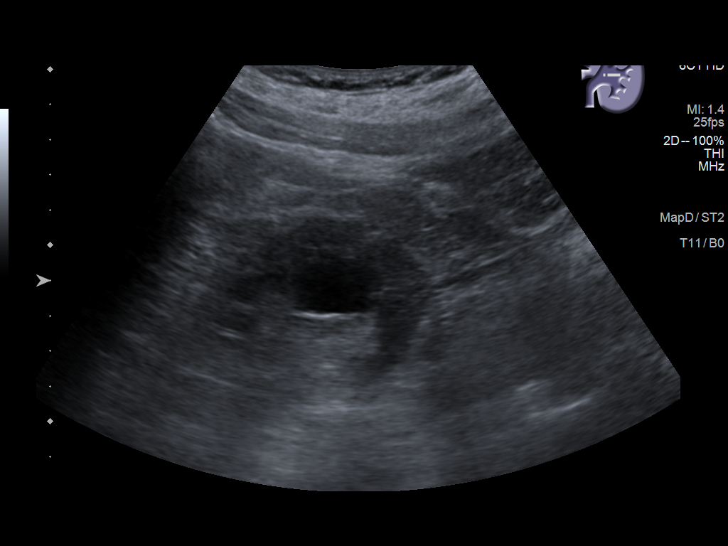
[im 56/56]
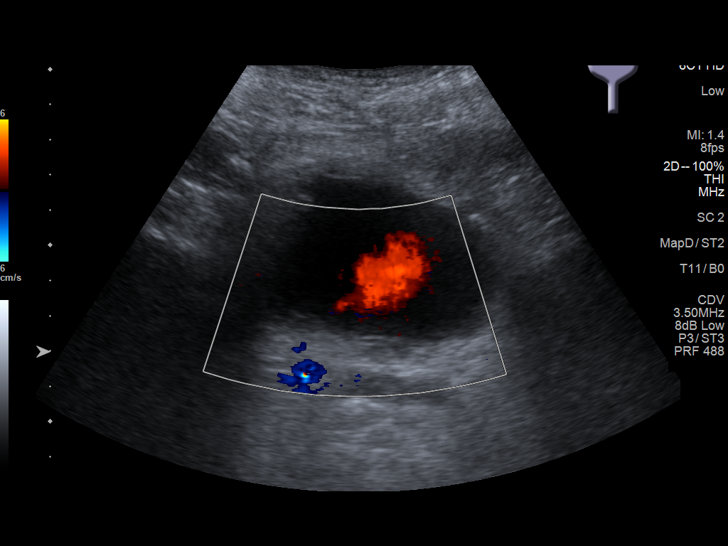

[14 of 25 positions shown; findings below may reference images not displayed]

FINDINGS: Right Kidney:

Renal measurements: 10.2 x 5.4 x 4.9 cm = volume: 140.8 mL. Normal
renal cortical thickness and echogenicity. No hydronephrosis.
Multiple cysts measuring up to 9 cm off the superior pole.

Left Kidney:

Renal measurements: 11.4 x 5.2 x 5.1 cm = volume: 156.2 mL. Normal
renal cortical thickness and echogenicity. No hydronephrosis.
Multiple cysts measuring up to 2.6 cm off of the midpole.

Bladder:

Appears normal for degree of bladder distention.

Other:

None.
IMPRESSION: No hydronephrosis.
